# Patient Record
Sex: Female | Born: 1990 | Hispanic: No | Marital: Single | Smoking: Current every day smoker
Health system: Southern US, Community
[De-identification: ages and names within clinical notes are randomized; demographics above are authoritative.]

## PROBLEM LIST (undated history)

## (undated) DIAGNOSIS — J45909 Unspecified asthma, uncomplicated: Secondary | ICD-10-CM

## (undated) DIAGNOSIS — F909 Attention-deficit hyperactivity disorder, unspecified type: Secondary | ICD-10-CM

## (undated) DIAGNOSIS — M722 Plantar fascial fibromatosis: Secondary | ICD-10-CM

## (undated) DIAGNOSIS — F319 Bipolar disorder, unspecified: Secondary | ICD-10-CM

## (undated) DIAGNOSIS — T7840XA Allergy, unspecified, initial encounter: Secondary | ICD-10-CM

## (undated) DIAGNOSIS — N809 Endometriosis, unspecified: Secondary | ICD-10-CM

## (undated) DIAGNOSIS — F329 Major depressive disorder, single episode, unspecified: Secondary | ICD-10-CM

## (undated) DIAGNOSIS — F32A Depression, unspecified: Secondary | ICD-10-CM

## (undated) HISTORY — DX: Depression, unspecified: F32.A

## (undated) HISTORY — DX: Plantar fascial fibromatosis: M72.2

## (undated) HISTORY — DX: Attention-deficit hyperactivity disorder, unspecified type: F90.9

## (undated) HISTORY — DX: Allergy, unspecified, initial encounter: T78.40XA

## (undated) HISTORY — PX: BIOPSY ENDOMETRIAL: PRO11

## (undated) HISTORY — DX: Bipolar disorder, unspecified: F31.9

## (undated) HISTORY — DX: Major depressive disorder, single episode, unspecified: F32.9

---
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
## 2012-12-19 ENCOUNTER — Emergency Department: Payer: Self-pay | Admitting: Emergency Medicine

## 2013-02-15 ENCOUNTER — Emergency Department: Payer: Self-pay | Admitting: Emergency Medicine

## 2013-07-22 ENCOUNTER — Emergency Department: Payer: Self-pay | Admitting: Emergency Medicine

## 2013-07-22 LAB — CBC WITH DIFFERENTIAL/PLATELET
BASOS ABS: 0.1 10*3/uL (ref 0.0–0.1)
BASOS PCT: 0.7 %
EOS PCT: 3.3 %
Eosinophil #: 0.4 10*3/uL (ref 0.0–0.7)
HCT: 39.3 % (ref 35.0–47.0)
HGB: 12.9 g/dL (ref 12.0–16.0)
LYMPHS ABS: 2.8 10*3/uL (ref 1.0–3.6)
Lymphocyte %: 25 %
MCH: 28.7 pg (ref 26.0–34.0)
MCHC: 33 g/dL (ref 32.0–36.0)
MCV: 87 fL (ref 80–100)
MONOS PCT: 5.9 %
Monocyte #: 0.7 x10 3/mm (ref 0.2–0.9)
Neutrophil #: 7.4 10*3/uL — ABNORMAL HIGH (ref 1.4–6.5)
Neutrophil %: 65.1 %
PLATELETS: 206 10*3/uL (ref 150–440)
RBC: 4.51 10*6/uL (ref 3.80–5.20)
RDW: 14.2 % (ref 11.5–14.5)
WBC: 11.3 10*3/uL — ABNORMAL HIGH (ref 3.6–11.0)

## 2013-07-22 LAB — WET PREP, GENITAL

## 2013-07-22 LAB — URINALYSIS, COMPLETE
BACTERIA: NONE SEEN
BLOOD: NEGATIVE
Bilirubin,UR: NEGATIVE
Glucose,UR: NEGATIVE mg/dL (ref 0–75)
Ketone: NEGATIVE
Leukocyte Esterase: NEGATIVE
NITRITE: NEGATIVE
PH: 6 (ref 4.5–8.0)
PROTEIN: NEGATIVE
RBC,UR: 1 /HPF (ref 0–5)
Specific Gravity: 1.029 (ref 1.003–1.030)
Squamous Epithelial: 1

## 2013-07-22 LAB — COMPREHENSIVE METABOLIC PANEL
ALBUMIN: 3.4 g/dL (ref 3.4–5.0)
ALK PHOS: 63 U/L
ANION GAP: 5 — AB (ref 7–16)
AST: 18 U/L (ref 15–37)
BILIRUBIN TOTAL: 0.1 mg/dL — AB (ref 0.2–1.0)
BUN: 10 mg/dL (ref 7–18)
CHLORIDE: 111 mmol/L — AB (ref 98–107)
CREATININE: 0.81 mg/dL (ref 0.60–1.30)
Calcium, Total: 8.8 mg/dL (ref 8.5–10.1)
Co2: 25 mmol/L (ref 21–32)
EGFR (Non-African Amer.): >60
Glucose: 100 mg/dL — ABNORMAL HIGH (ref 65–99)
Osmolality: 280 (ref 275–301)
POTASSIUM: 4 mmol/L (ref 3.5–5.1)
SGPT (ALT): 18 U/L (ref 12–78)
SODIUM: 141 mmol/L (ref 136–145)
Total Protein: 7.3 g/dL (ref 6.4–8.2)

## 2013-07-22 LAB — GC/CHLAMYDIA PROBE AMP

## 2013-07-22 LAB — LIPASE, BLOOD: Lipase: 101 U/L (ref 73–393)

## 2013-11-03 ENCOUNTER — Emergency Department: Payer: Self-pay | Admitting: Emergency Medicine

## 2014-01-17 ENCOUNTER — Emergency Department: Payer: Self-pay | Admitting: Emergency Medicine

## 2014-09-13 ENCOUNTER — Other Ambulatory Visit: Payer: Self-pay

## 2014-09-13 ENCOUNTER — Encounter: Payer: Self-pay | Admitting: Emergency Medicine

## 2014-09-13 ENCOUNTER — Emergency Department: Payer: Self-pay

## 2014-09-13 ENCOUNTER — Emergency Department
Admission: EM | Admit: 2014-09-13 | Discharge: 2014-09-13 | Disposition: A | Discharge status: Home / Self Care | Payer: Self-pay | Attending: Student | Admitting: Student

## 2014-09-13 DIAGNOSIS — Z720 Tobacco use: Secondary | ICD-10-CM | POA: Insufficient documentation

## 2014-09-13 DIAGNOSIS — Z3202 Encounter for pregnancy test, result negative: Secondary | ICD-10-CM | POA: Insufficient documentation

## 2014-09-13 DIAGNOSIS — M545 Low back pain: Secondary | ICD-10-CM | POA: Insufficient documentation

## 2014-09-13 DIAGNOSIS — R55 Syncope and collapse: Secondary | ICD-10-CM | POA: Insufficient documentation

## 2014-09-13 DIAGNOSIS — I951 Orthostatic hypotension: Secondary | ICD-10-CM

## 2014-09-13 HISTORY — DX: Unspecified asthma, uncomplicated: J45.909

## 2014-09-13 HISTORY — DX: Endometriosis, unspecified: N80.9

## 2014-09-13 LAB — URINALYSIS COMPLETE WITH MICROSCOPIC (ARMC ONLY)
Bilirubin Urine: NEGATIVE
Glucose, UA: NEGATIVE mg/dL
Hgb urine dipstick: NEGATIVE
Ketones, ur: NEGATIVE mg/dL
Leukocytes, UA: NEGATIVE
Nitrite: NEGATIVE
Protein, ur: NEGATIVE mg/dL
Specific Gravity, Urine: 1.019 (ref 1.005–1.030)
pH: 5 (ref 5.0–8.0)

## 2014-09-13 LAB — CBC WITH DIFFERENTIAL/PLATELET
Basophils Absolute: 0.1 K/uL (ref 0–0.1)
Basophils Relative: 1 %
Eosinophils Absolute: 0.3 K/uL (ref 0–0.7)
Eosinophils Relative: 2 %
HCT: 40.8 % (ref 35.0–47.0)
Hemoglobin: 13.6 g/dL (ref 12.0–16.0)
Lymphocytes Relative: 25 %
Lymphs Abs: 3.3 K/uL (ref 1.0–3.6)
MCH: 28.1 pg (ref 26.0–34.0)
MCHC: 33.3 g/dL (ref 32.0–36.0)
MCV: 84.5 fL (ref 80.0–100.0)
Monocytes Absolute: 0.8 K/uL (ref 0.2–0.9)
Monocytes Relative: 7 %
Neutro Abs: 8.5 K/uL — ABNORMAL HIGH (ref 1.4–6.5)
Neutrophils Relative %: 65 %
Platelets: 187 K/uL (ref 150–440)
RBC: 4.83 MIL/uL (ref 3.80–5.20)
RDW: 13.4 % (ref 11.5–14.5)
WBC: 12.9 K/uL — ABNORMAL HIGH (ref 3.6–11.0)

## 2014-09-13 LAB — COMPREHENSIVE METABOLIC PANEL WITH GFR
ALT: 17 U/L (ref 14–54)
AST: 18 U/L (ref 15–41)
Albumin: 3.5 g/dL (ref 3.5–5.0)
Alkaline Phosphatase: 45 U/L (ref 38–126)
Anion gap: 7 (ref 5–15)
BUN: 9 mg/dL (ref 6–20)
CO2: 26 mmol/L (ref 22–32)
Calcium: 8.8 mg/dL — ABNORMAL LOW (ref 8.9–10.3)
Chloride: 108 mmol/L (ref 101–111)
Creatinine, Ser: 0.7 mg/dL (ref 0.44–1.00)
GFR calc Af Amer: >60 mL/min (ref >60)
GFR calc non Af Amer: >60 mL/min (ref >60)
Glucose, Bld: 118 mg/dL — ABNORMAL HIGH (ref 65–99)
Potassium: 4 mmol/L (ref 3.5–5.1)
Sodium: 141 mmol/L (ref 135–145)
Total Bilirubin: 0.3 mg/dL (ref 0.3–1.2)
Total Protein: 6.6 g/dL (ref 6.5–8.1)

## 2014-09-13 LAB — GLUCOSE, CAPILLARY: Glucose-Capillary: 110 mg/dL — ABNORMAL HIGH (ref 65–99)

## 2014-09-13 LAB — PREGNANCY, URINE: Preg Test, Ur: NEGATIVE

## 2014-09-13 MED ORDER — OXYCODONE HCL 5 MG PO TABS
ORAL_TABLET | ORAL | Status: AC
  Administered 2014-09-13: 5 mg via ORAL
  Filled 2014-09-13: qty 1

## 2014-09-13 MED ORDER — IBUPROFEN 600 MG PO TABS
600.0000 mg | ORAL_TABLET | Freq: Once | ORAL | Status: AC
  Administered 2014-09-13: 600 mg via ORAL

## 2014-09-13 MED ORDER — OXYCODONE HCL 5 MG PO TABS
5.0000 mg | ORAL_TABLET | Freq: Once | ORAL | Status: AC
  Administered 2014-09-13: 5 mg via ORAL

## 2014-09-13 MED ORDER — IBUPROFEN 600 MG PO TABS
ORAL_TABLET | ORAL | Status: AC
  Administered 2014-09-13: 600 mg via ORAL
  Filled 2014-09-13: qty 1

## 2014-09-13 NOTE — ED Notes (Signed)
Pt presents to ER alert and in NAD. Pt states she donated plasma today and had a syncopal episode at work. Pt reports generalized soreness from hitting the floor. Moving neck normally at this time.

## 2014-09-13 NOTE — ED Provider Notes (Signed)
[facility] Emergency Department Provider Note  ____________________________________________  Time seen: Approximately 2:21 AM  I have reviewed the triage vital signs and the nursing notes.   HISTORY  Chief Complaint Loss of Consciousness    HPI Arvid RightBridget M Pester is a 24 y.o. female with history of asthma and endometriosis presents for evaluation of syncope, sudden onset yesterday, now resolved. Patient reports that yesterday she donated plasma which she usually does to help supplement her income. 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 She is complaining of right back pain and right chest pain, constant, aching with spasms since the fall. She did hit her head but reports that her headache is mild. No neck pain. No vomiting or diarrhea. No chest pain or difficulty breathing. No modifying factors. She has not taken any medication for pain at home.   Past Medical History  Diagnosis Date  . Asthma   . Endometriosis     There are no active problems to display for this patient.   History reviewed. No pertinent past surgical history.  No current outpatient prescriptions on file.  Allergies Review of patient's allergies indicates no known allergies.  History reviewed. No pertinent family history.  Social History History  Substance Use Topics  . Smoking status: Current Every Day Smoker  . Smokeless tobacco: Not on file  . Alcohol Use: No    Review of Systems Constitutional: No fever/chills Eyes: No visual changes. ENT: No sore throat. Cardiovascular: Denies chest pain. Respiratory: Denies shortness of breath. Gastrointestinal: No abdominal pain.  No nausea, no vomiting.  No diarrhea.  No constipation. Genitourinary: Negative for dysuria. Musculoskeletal: + for back pain. Skin: Negative for rash. Neurological: Negative for headaches, focal weakness or  numbness.  10-point ROS otherwise negative.  ____________________________________________   PHYSICAL EXAM:  VITAL SIGNS: ED Triage Vitals  Enc Vitals Group     BP 09/13/14 0141 128/51 mmHg     Pulse Rate 09/13/14 0140 68     Resp 09/13/14 0140 20     Temp 09/13/14 0140 98.7 F (37.1 C)     Temp Source 09/13/14 0140 Oral     SpO2 09/13/14 0207 99 %     Weight 09/13/14 0140 236 lb (107.049 kg)     Height 09/13/14 0140 5\' 6"  (1.676 m)     Head Cir --      Peak Flow --      Pain Score 09/13/14 0140 7     Pain Loc --      Pain Edu? --      Excl. in GC? --     Constitutional: Alert and oriented. Well appearing and in no acute distress. Eyes: Conjunctivae are normal. PERRL. EOMI. Head: Atraumatic. Nose: No congestion/rhinnorhea. Mouth/Throat: Mucous membranes are moist.  Oropharynx non-erythematous. Neck: No stridor. No cervical spine tenderness to palpation. Cardiovascular: Normal rate, regular rhythm. Grossly normal heart sounds.  Good peripheral circulation. Respiratory: Normal respiratory effort.  No retractions. Lungs CTAB. Gastrointestinal: Soft and nontender. No distention. No abdominal bruits. No CVA tenderness. Genitourinary: deferred Musculoskeletal: No lower extremity tenderness nor edema.  No joint effusions. Mild tenderness to palpation throughout the paravertebral muscles of the right lumbar spine, no midline tenderness to palpation throughout the T or L-spine, no bony step-offs or deformity. Neurologic:  Normal speech and language. No gross focal neurologic deficits are appreciated. Speech is normal. No gait instability.  Skin:  Skin is warm, dry and intact. No rash noted. Psychiatric: Mood and affect are normal. Speech and behavior are normal.  ____________________________________________   LABS (all labs ordered are listed, but only abnormal results are displayed)  Labs Reviewed  COMPREHENSIVE METABOLIC PANEL - Abnormal; Notable for the following:     Glucose, Bld 118 (*)    Calcium 8.8 (*)    All other components within normal limits  CBC WITH DIFFERENTIAL/PLATELET - Abnormal; Notable for the following:    WBC 12.9 (*)    Neutro Abs 8.5 (*)    All other components within normal limits  URINALYSIS COMPLETEWITH MICROSCOPIC ([facility] ONLY) - Abnormal; Notable for the following:    Color, Urine YELLOW (*)    APPearance CLEAR (*)    Bacteria, UA RARE (*)    Squamous Epithelial / LPF 0-5 (*)    All other components within normal limits  GLUCOSE, CAPILLARY - Abnormal; Notable for the following:    Glucose-Capillary 110 (*)    All other components within normal limits  PREGNANCY, URINE  POCT CBG (FASTING - GLUCOSE)-MANUAL ENTRY   ____________________________________________  EKG  ED ECG REPORT I, Gayla Doss, the attending physician, personally viewed and interpreted this ECG.   Date: 09/13/2014  EKG Time: 03:28  Rate: 60  Rhythm: normal EKG, normal sinus rhythm  Axis: normal  Intervals:none  ST&T Change: No acute ST segment change.  ____________________________________________  RADIOLOGY  CXR  IMPRESSION: No active cardiopulmonary disease.  ____________________________________________   PROCEDURES  Procedure(s) performed: None  Critical Care performed: No  ____________________________________________   INITIAL IMPRESSION / ASSESSMENT AND PLAN / ED COURSE  Pertinent labs & imaging results that were available during my care of the patient were reviewed by me and considered in my medical decision making (see chart for details).  ACHAIA GARLOCK is a 24 y.o. female with history of asthma and endometriosis presents for evaluation of syncope, sudden onset yesterday, now resolved. On exam, she is generally well-appearing and in no acute distress. Vital signs are stable, she is afebrile. She has mild tenderness to palpation throughout the paravertebral muscles of the right lumbar spine spine and I suspect her  injuries are musculoskeletal in nature. Suspect vasovagal syncope, likely orthostatic syncope in the setting of intravascular depletion given plasma donation today. EKG is reassuring, no chest pain no difficulty breathing, intact neuro exam, doubt purely neurogenic or cardiac cause of syncope in this young, healthy patient. Pain significantly improved at this point. I have cautioned her about risks of dehydration with plasma donation. She has declined IV fluids here. Return precautions discussed. DC home. No indication for head or C-spine imaging according to [place] CT head and C-spine rules. Labs with mild leukocytosis which is nonspecific and likely stressed/catecholamine induced. ____________________________________________   FINAL CLINICAL IMPRESSION(S) / ED DIAGNOSES  Final diagnoses:  Orthostatic syncope      Gayla Doss, MD 09/13/14 [phone]

## 2014-09-13 NOTE — ED Notes (Signed)
Pt presents to ED d/t pain caused by a fall at work around 12hrs ago. Pt reports giving plasma 2hrs prior to the syncopal episode. Pt states she "got really, really hot and the next thing I knew I was on the ground". Pt reports the fall was witnessed and reports she "just hit the floor". Pt c/o back pain, right-sided shoulder pain, and right-sided chest pain. Pt is very anxious and tearful, but otherwise in NAD, A&O, with s/o at bedside.

## 2015-03-19 ENCOUNTER — Ambulatory Visit (INDEPENDENT_AMBULATORY_CARE_PROVIDER_SITE_OTHER): Payer: Medicaid Other

## 2015-03-19 VITALS — BP 115/68 | HR 64 | Wt 235.5 lb

## 2015-03-19 DIAGNOSIS — Z3491 Encounter for supervision of normal pregnancy, unspecified, first trimester: Secondary | ICD-10-CM

## 2015-03-19 DIAGNOSIS — Z8742 Personal history of other diseases of the female genital tract: Secondary | ICD-10-CM

## 2015-03-19 DIAGNOSIS — Z36 Encounter for antenatal screening of mother: Secondary | ICD-10-CM

## 2015-03-19 DIAGNOSIS — Z3687 Encounter for antenatal screening for uncertain dates: Secondary | ICD-10-CM

## 2015-03-19 DIAGNOSIS — R638 Other symptoms and signs concerning food and fluid intake: Secondary | ICD-10-CM

## 2015-03-19 DIAGNOSIS — Z113 Encounter for screening for infections with a predominantly sexual mode of transmission: Secondary | ICD-10-CM

## 2015-03-19 NOTE — Progress Notes (Signed)
Arvid RightBridget M Heatley presents for NOB nurse interview visit. G-1.  P-0.  Confirmation at [facility]. LMP - 01/12/2015- EDD- 10/19/2015.  9 3/7   H/o of endometriosis. Pregnancy education material explained and given. _NO__ cats in the home. NOB labs ordered. TSH and HbgA1 ordered due to Increased BMI. HIV lab and Drug screen were explained and ordered. PNV encouraged. NT to discuss with provider. Mild nausea and vomiting NO meds needed at this time. Needs tdap at 28 weeks. Last pap -2013 -neg. Viability scan in 1-2 weeks.  Pt. To follow up with provider in _+/- 3_ weeks for NOB physical with [facility].  All questions answered.

## 2015-03-19 NOTE — Patient Instructions (Signed)
Commonly Asked Questions During Pregnancy  Cats: A parasite can be excreted in cat feces.  To avoid exposure you need to have another person empty the little box.  If you must empty the litter box you will need to wear gloves.  Wash your hands after handling your cat.  This parasite can also be found in raw or undercooked meat so this should also be avoided.  Colds, Sore Throats, Flu: Please check your medication sheet to see what you can take for symptoms.  If your symptoms are unrelieved by these medications please call the office.  Dental Work: Most any dental work your dentist recommends is permitted.  X-rays should only be taken during the first trimester if absolutely necessary.  Your abdomen should be shielded with a lead apron during all x-rays.  Please notify your provider prior to receiving any x-rays.  Novocaine is fine; gas is not recommended.  If your dentist requires a note from us prior to dental work please call the office and we will provide one for you.  Exercise: Exercise is an important part of staying healthy during your pregnancy.  You may continue most exercises you were accustomed to prior to pregnancy.  Later in your pregnancy you will most likely notice you have difficulty with activities requiring balance like riding a bicycle.  It is important that you listen to your body and avoid activities that put you at a higher risk of falling.  Adequate rest and staying well hydrated are a must!  If you have questions about the safety of specific activities ask your provider.    Exposure to Children with illness: Try to avoid obvious exposure; report any symptoms to us when noted,  If you have chicken pos, red measles or mumps, you should be immune to these diseases.   Please do not take any vaccines while pregnant unless you have checked with your OB provider.  Fetal Movement: After 28 weeks we recommend you do "kick counts" twice daily.  Lie or sit down in a calm quiet environment and  count your baby movements "kicks".  You should feel your baby at least 10 times per hour.  If you have not felt 10 kicks within the first hour get up, walk around and have something sweet to eat or drink then repeat for an additional hour.  If count remains less than 10 per hour notify your provider.  Fumigating: Follow your pest control agent's advice as to how long to stay out of your home.  Ventilate the area well before re-entering.  Hemorrhoids:   Most over-the-counter preparations can be used during pregnancy.  Check your medication to see what is safe to use.  It is important to use a stool softener or fiber in your diet and to drink lots of liquids.  If hemorrhoids seem to be getting worse please call the office.   Hot Tubs:  Hot tubs Jacuzzis and saunas are not recommended while pregnant.  These increase your internal body temperature and should be avoided.  Intercourse:  Sexual intercourse is safe during pregnancy as long as you are comfortable, unless otherwise advised by your provider.  Spotting may occur after intercourse; report any bright red bleeding that is heavier than spotting.  Labor:  If you know that you are in labor, please go to the hospital.  If you are unsure, please call the office and let us help you decide what to do.  Lifting, straining, etc:  If your job requires heavy   lifting or straining please check with your provider for any limitations.  Generally, you should not lift items heavier than that you can lift simply with your hands and arms (no back muscles)  Painting:  Paint fumes do not harm your pregnancy, but may make you ill and should be avoided if possible.  Latex or water based paints have less odor than oils.  Use adequate ventilation while painting.  Permanents & Hair Color:  Chemicals in hair dyes are not recommended as they cause increase hair dryness which can increase hair loss during pregnancy.  " Highlighting" and permanents are allowed.  Dye may be  absorbed differently and permanents may not hold as well during pregnancy.  Sunbathing:  Use a sunscreen, as skin burns easily during pregnancy.  Drink plenty of fluids; avoid over heating.  Tanning Beds:  Because their possible side effects are still unknown, tanning beds are not recommended.  Ultrasound Scans:  Routine ultrasounds are performed at approximately 20 weeks.  You will be able to see your baby's general anatomy an if you would like to know the gender this can usually be determined as well.  If it is questionable when you conceived you may also receive an ultrasound early in your pregnancy for dating purposes.  Otherwise ultrasound exams are not routinely performed unless there is a medical necessity.  Although you can request a scan we ask that you pay for it when conducted because insurance does not cover " patient request" scans.  Work: If your pregnancy proceeds without complications you may work until your due date, unless your physician or employer advises otherwise.  Round Ligament Pain/Pelvic Discomfort:  Sharp, shooting pains not associated with bleeding are fairly common, usually occurring in the second trimester of pregnancy.  They tend to be worse when standing up or when you remain standing for long periods of time.  These are the result of pressure of certain pelvic ligaments called "round ligaments".  Rest, Tylenol and heat seem to be the most effective relief.  As the womb and fetus grow, they rise out of the pelvis and the discomfort improves.  Please notify the office if your pain seems different than that described.  It may represent a more serious condition. First Trimester of Pregnancy The first trimester of pregnancy is from week 1 until the end of week 12 (months 1 through 3). A week after a sperm fertilizes an egg, the egg will implant on the wall of the uterus. This embryo will begin to develop into a baby. Genes from you and your partner are forming the baby. The  female genes determine whether the baby is a boy or a girl. At 6-8 weeks, the eyes and face are formed, and the heartbeat can be seen on ultrasound. At the end of 12 weeks, all the baby's organs are formed.  Now that you are pregnant, you will want to do everything you can to have a healthy baby. Two of the most important things are to get good prenatal care and to follow your health care provider's instructions. Prenatal care is all the medical care you receive before the baby's birth. This care will help prevent, find, and treat any problems during the pregnancy and childbirth. BODY CHANGES Your body goes through many changes during pregnancy. The changes vary from woman to woman.   You may gain or lose a couple of pounds at first.  You may feel sick to your stomach (nauseous) and throw up (vomit). If the   vomiting is uncontrollable, call your health care provider.  You may tire easily.  You may develop headaches that can be relieved by medicines approved by your health care provider.  You may urinate more often. Painful urination may mean you have a bladder infection.  You may develop heartburn as a result of your pregnancy.  You may develop constipation because certain hormones are causing the muscles that push waste through your intestines to slow down.  You may develop hemorrhoids or swollen, bulging veins (varicose veins).  Your breasts may begin to grow larger and become tender. Your nipples may stick out more, and the tissue that surrounds them (areola) may become darker.  Your gums may bleed and may be sensitive to brushing and flossing.  Dark spots or blotches (chloasma, mask of pregnancy) may develop on your face. This will likely fade after the baby is born.  Your menstrual periods will stop.  You may have a loss of appetite.  You may develop cravings for certain kinds of food.  You may have changes in your emotions from day to day, such as being excited to be pregnant or  being concerned that something may go wrong with the pregnancy and baby.  You may have more vivid and strange dreams.  You may have changes in your hair. These can include thickening of your hair, rapid growth, and changes in texture. Some women also have hair loss during or after pregnancy, or hair that feels dry or thin. Your hair will most likely return to normal after your baby is born. WHAT TO EXPECT AT YOUR PRENATAL VISITS During a routine prenatal visit:  You will be weighed to make sure you and the baby are growing normally.  Your blood pressure will be taken.  Your abdomen will be measured to track your baby's growth.  The fetal heartbeat will be listened to starting around week 10 or 12 of your pregnancy.  Test results from any previous visits will be discussed. Your health care provider may ask you:  How you are feeling.  If you are feeling the baby move.  If you have had any abnormal symptoms, such as leaking fluid, bleeding, severe headaches, or abdominal cramping.  If you are using any tobacco products, including cigarettes, chewing tobacco, and electronic cigarettes.  If you have any questions. Other tests that may be performed during your first trimester include:  Blood tests to find your blood type and to check for the presence of any previous infections. They will also be used to check for low iron levels (anemia) and Rh antibodies. Later in the pregnancy, blood tests for diabetes will be done along with other tests if problems develop.  Urine tests to check for infections, diabetes, or protein in the urine.  An ultrasound to confirm the proper growth and development of the baby.  An amniocentesis to check for possible genetic problems.  Fetal screens for spina bifida and Down syndrome.  You may need other tests to make sure you and the baby are doing well.  HIV (human immunodeficiency virus) testing. Routine prenatal testing includes screening for HIV,  unless you choose not to have this test. HOME CARE INSTRUCTIONS  Medicines  Follow your health care provider's instructions regarding medicine use. Specific medicines may be either safe or unsafe to take during pregnancy.  Take your prenatal vitamins as directed.  If you develop constipation, try taking a stool softener if your health care provider approves. Diet  Eat regular, well-balanced meals. Choose   a variety of foods, such as meat or vegetable-based protein, fish, milk and low-fat dairy products, vegetables, fruits, and whole grain breads and cereals. Your health care provider will help you determine the amount of weight gain that is right for you.  Avoid raw meat and uncooked cheese. These carry germs that can cause birth defects in the baby.  Eating four or five small meals rather than three large meals a day may help relieve nausea and vomiting. If you start to feel nauseous, eating a few soda crackers can be helpful. Drinking liquids between meals instead of during meals also seems to help nausea and vomiting.  If you develop constipation, eat more high-fiber foods, such as fresh vegetables or fruit and whole grains. Drink enough fluids to keep your urine clear or pale yellow. Activity and Exercise  Exercise only as directed by your health care provider. Exercising will help you:  Control your weight.  Stay in shape.  Be prepared for labor and delivery.  Experiencing pain or cramping in the lower abdomen or low back is a good sign that you should stop exercising. Check with your health care provider before continuing normal exercises.  Try to avoid standing for long periods of time. Move your legs often if you must stand in one place for a long time.  Avoid heavy lifting.  Wear low-heeled shoes, and practice good posture.  You may continue to have sex unless your health care provider directs you otherwise. Relief of Pain or Discomfort  Wear a good support bra for  breast tenderness.   Take warm sitz baths to soothe any pain or discomfort caused by hemorrhoids. Use hemorrhoid cream if your health care provider approves.   Rest with your legs elevated if you have leg cramps or low back pain.  If you develop varicose veins in your legs, wear support hose. Elevate your feet for 15 minutes, 3-4 times a day. Limit salt in your diet. Prenatal Care  Schedule your prenatal visits by the twelfth week of pregnancy. They are usually scheduled monthly at first, then more often in the last 2 months before delivery.  Write down your questions. Take them to your prenatal visits.  Keep all your prenatal visits as directed by your health care provider. Safety  Wear your seat belt at all times when driving.  Make a list of emergency phone numbers, including numbers for family, friends, the hospital, and police and fire departments. General Tips  Ask your health care provider for a referral to a local prenatal education class. Begin classes no later than at the beginning of month 6 of your pregnancy.  Ask for help if you have counseling or nutritional needs during pregnancy. Your health care provider can offer advice or refer you to specialists for help with various needs.  Do not use hot tubs, steam rooms, or saunas.  Do not douche or use tampons or scented sanitary pads.  Do not cross your legs for long periods of time.  Avoid cat litter boxes and soil used by cats. These carry germs that can cause birth defects in the baby and possibly loss of the fetus by miscarriage or stillbirth.  Avoid all smoking, herbs, alcohol, and medicines not prescribed by your health care provider. Chemicals in these affect the formation and growth of the baby.  Do not use any tobacco products, including cigarettes, chewing tobacco, and electronic cigarettes. If you need help quitting, ask your health care provider. You may receive counseling support and   other resources to help  you quit.  Schedule a dentist appointment. At home, brush your teeth with a soft toothbrush and be gentle when you floss. SEEK MEDICAL CARE IF:   You have dizziness.  You have mild pelvic cramps, pelvic pressure, or nagging pain in the abdominal area.  You have persistent nausea, vomiting, or diarrhea.  You have a bad smelling vaginal discharge.  You have pain with urination.  You notice increased swelling in your face, hands, legs, or ankles. SEEK IMMEDIATE MEDICAL CARE IF:   You have a fever.  You are leaking fluid from your vagina.  You have spotting or bleeding from your vagina.  You have severe abdominal cramping or pain.  You have rapid weight gain or loss.  You vomit blood or material that looks like coffee grounds.  You are exposed to German measles and have never had them.  You are exposed to fifth disease or chickenpox.  You develop a severe headache.  You have shortness of breath.  You have any kind of trauma, such as from a fall or a car accident.   This information is not intended to replace advice given to you by your health care provider. Make sure you discuss any questions you have with your health care provider.   Document Released: 02/21/2001 Document Revised: 03/20/2014 Document Reviewed: 01/07/2013 Elsevier Interactive Patient Education 2016 [employer] How a Baby Grows During Pregnancy Pregnancy begins when a female's sperm enters a female's egg (fertilization). This happens in one of the tubes (fallopian tubes) that connect the ovaries to the womb (uterus). The fertilized egg is called an embryo until it reaches 10 weeks. From 10 weeks until birth, it is called a fetus. The fertilized egg moves down the fallopian tube to the uterus. Then it implants into the lining of the uterus and begins to grow. The developing fetus receives oxygen and nutrients through the pregnant woman's bloodstream and the tissues that grow (placenta) to support the fetus.  The placenta is the life support system for the fetus. It provides nutrition and removes waste. Learning as much as you can about your pregnancy and how your baby is developing can help you enjoy the experience. It can also make you aware of when there might be a problem and when to ask questions. HOW LONG DOES A TYPICAL PREGNANCY LAST? A pregnancy usually lasts 280 days, or about 40 weeks. Pregnancy is divided into three trimesters:  First trimester: 0-13 weeks.  Second trimester: 14-27 weeks.  Third trimester: 28-40 weeks. The day when your baby is considered ready to be born (full term) is your estimated date of delivery. HOW DOES MY BABY DEVELOP MONTH BY MONTH? First month  The fertilized egg attaches to the inside of the uterus.  Some cells will form the placenta. Others will form the fetus.  The arms, legs, brain, spinal cord, lungs, and heart begin to develop.  At the end of the first month, the heart begins to beat. Second month  The bones, inner ear, eyelids, hands, and feet form.  The genitals develop.  By the end of 8 weeks, all major organs are developing. Third month  All of the internal organs are forming.  Teeth develop below the gums.  Bones and muscles begin to grow. The spine can flex.  The skin is transparent.  Fingernails and toenails begin to form.  Arms and legs continue to grow longer, and hands and feet develop.  The fetus is about 3 in (7.6   cm) long. Fourth month  The placenta is completely formed.  The external sex organs, neck, outer ear, eyebrows, eyelids, and fingernails are formed.  The fetus can hear, swallow, and move its arms and legs.  The kidneys begin to produce urine.  The skin is covered with a white waxy coating (vernix) and very fine hair (lanugo). Fifth month  The fetus moves around more and can be felt for the first time (quickening).  The fetus starts to sleep and wake up and may begin to suck its finger.  The  nails grow to the end of the fingers.  The organ in the digestive system that makes bile (gallbladder) functions and helps to digest the nutrients.  If your baby is a girl, eggs are present in her ovaries. If your baby is a boy, testicles start to move down into his scrotum. Sixth month  The lungs are formed, but the fetus is not yet able to breathe.  The eyes open. The brain continues to develop.  Your baby has fingerprints and toe prints. Your baby's hair grows thicker.  At the end of the second trimester, the fetus is about 9 in (22.9 cm) long. Seventh month  The fetus kicks and stretches.  The eyes are developed enough to sense changes in light.  The hands can make a grasping motion.  The fetus responds to sound. Eighth month  All organs and body systems are fully developed and functioning.  Bones harden and taste buds develop. The fetus may hiccup.  Certain areas of the brain are still developing. The skull remains soft. Ninth month  The fetus gains about  lb (0.23 kg) each week.  The lungs are fully developed.  Patterns of sleep develop.  The fetus's head typically moves into a head-down position (vertex) in the uterus to prepare for birth. If the buttocks move into a vertex position instead, the baby is breech.  The fetus weighs 6-9 lbs (2.72-4.08 kg) and is 19-20 in (48.26-50.8 cm) long. WHAT CAN I DO TO HAVE A HEALTHY PREGNANCY AND HELP MY BABY DEVELOP? Eating and Drinking  Eat a healthy diet.  Talk with your health care provider to make sure that you are getting the nutrients that you and your baby need.  Visit www.choosemyplate.gov to learn about creating a healthy diet.  Gain a healthy amount of weight during pregnancy as advised by your health care provider. This is usually 25-35 pounds. You may need to:  Gain more if you were underweight before getting pregnant or if you are pregnant with more than one baby.  Gain less if you were overweight or  obese when you got pregnant. Medicines and Vitamins  Take prenatal vitamins as directed by your health care provider. These include vitamins such as folic acid, iron, calcium, and vitamin D. They are important for healthy development.  Take medicines only as directed by your health care provider. Read labels and ask a pharmacist or your health care provider whether over-the-counter medicines, supplements, and prescription drugs are safe to take during pregnancy. Activities  Be physically active as advised by your health care provider. Ask your health care provider to recommend activities that are safe for you to do, such as walking or swimming.  Do not participate in strenuous or extreme sports. Lifestyle  Do not drink alcohol.  Do not use any tobacco products, including cigarettes, chewing tobacco, or electronic cigarettes. If you need help quitting, ask your health care provider.  Do not use illegal drugs.   Safety  Avoid exposure to mercury, lead, or other heavy metals. Ask your health care provider about common sources of these heavy metals.  Avoid listeria infection during pregnancy. Follow these precautions:  Do not eat soft cheeses or deli meats.  Do not eat hot dogs unless they have been warmed up to the point of steaming, such as in the microwave oven.  Do not drink unpasteurized milk.  Avoid toxoplasmosis infection during pregnancy. Follow these precautions:  Do not change your cat's litter box, if you have a cat. Ask someone else to do this for you.  Wear gardening gloves while working in the yard. General Instructions  Keep all follow-up visits as directed by your health care provider. This is important. This includes prenatal care and screening tests.  Manage any chronic health conditions. Work closely with your health care provider to keep conditions, such as diabetes, under control. HOW DO I KNOW IF MY BABY IS DEVELOPING WELL? At each prenatal visit, your health  care provider will do several different tests to check on your health and keep track of your baby's development. These include:  Fundal height.  Your health care provider will measure your growing belly from top to bottom using a tape measure.  Your health care provider will also feel your belly to determine your baby's position.  Heartbeat.  An ultrasound in the first trimester can confirm pregnancy and show a heartbeat, depending on how far along you are.  Your health care provider will check your baby's heart rate at every prenatal visit.  As you get closer to your delivery date, you may have regular fetal heart rate monitoring to make sure that your baby is not in distress.  Second trimester ultrasound.  This ultrasound checks your baby's development. It also indicates your baby's gender. WHAT SHOULD I DO IF I HAVE CONCERNS ABOUT MY BABY'S DEVELOPMENT? Always talk with your health care provider about any concerns that you may have.   This information is not intended to replace advice given to you by your health care provider. Make sure you discuss any questions you have with your health care provider.   Document Released: 08/16/2007 Document Revised: 11/18/2014 Document Reviewed: 08/06/2013 Elsevier Interactive Patient Education 2016 [employer]     

## 2015-03-20 LAB — CBC WITH DIFFERENTIAL/PLATELET
Basophils Absolute: 0 x10E3/uL (ref 0.0–0.2)
Basos: 1 %
EOS (ABSOLUTE): 0.2 x10E3/uL (ref 0.0–0.4)
Eos: 3 %
Hematocrit: 38.5 % (ref 34.0–46.6)
Hemoglobin: 12.9 g/dL (ref 11.1–15.9)
Immature Grans (Abs): 0 x10E3/uL (ref 0.0–0.1)
Immature Granulocytes: 0 %
Lymphocytes Absolute: 1.8 x10E3/uL (ref 0.7–3.1)
Lymphs: 25 %
MCH: 26.2 pg — ABNORMAL LOW (ref 26.6–33.0)
MCHC: 33.5 g/dL (ref 31.5–35.7)
MCV: 78 fL — ABNORMAL LOW (ref 79–97)
Monocytes Absolute: 0.5 x10E3/uL (ref 0.1–0.9)
Monocytes: 7 %
Neutrophils Absolute: 4.7 x10E3/uL (ref 1.4–7.0)
Neutrophils: 64 %
Platelets: 250 x10E3/uL (ref 150–379)
RBC: 4.92 x10E6/uL (ref 3.77–5.28)
RDW: 14.2 % (ref 12.3–15.4)
WBC: 7.2 x10E3/uL (ref 3.4–10.8)

## 2015-03-20 LAB — URINALYSIS, ROUTINE W REFLEX MICROSCOPIC
Bilirubin, UA: NEGATIVE
GLUCOSE, UA: NEGATIVE
KETONES UA: NEGATIVE
Leukocytes, UA: NEGATIVE
NITRITE UA: NEGATIVE
PROTEIN UA: NEGATIVE
RBC, UA: NEGATIVE
SPEC GRAV UA: 1.026 (ref 1.005–1.030)
UUROB: 0.2 mg/dL (ref 0.2–1.0)
pH, UA: 6 (ref 5.0–7.5)

## 2015-03-20 LAB — RPR: RPR: NONREACTIVE

## 2015-03-20 LAB — "ABO AND RH ": Rh Factor: POSITIVE

## 2015-03-20 LAB — TSH: TSH: 0.781 u[IU]/mL (ref 0.450–4.500)

## 2015-03-20 LAB — HEPATITIS B SURFACE ANTIGEN: HEP B S AG: NEGATIVE

## 2015-03-20 LAB — HEMOGLOBIN A1C
ESTIMATED AVERAGE GLUCOSE: 108 mg/dL
HEMOGLOBIN A1C: 5.4 % (ref 4.8–5.6)

## 2015-03-20 LAB — ANTIBODY SCREEN: ANTIBODY SCREEN: NEGATIVE

## 2015-03-20 LAB — HIV ANTIBODY (ROUTINE TESTING W REFLEX): HIV SCREEN 4TH GENERATION: NONREACTIVE

## 2015-03-21 LAB — CULTURE, OB URINE

## 2015-03-21 LAB — VARICELLA ZOSTER ANTIBODY, IGG: VARICELLA: 2401 {index} (ref >165)

## 2015-03-21 LAB — RUBELLA SCREEN: Rubella Antibodies, IGG: 2.9 index (ref >0.99)

## 2015-03-21 LAB — URINE CULTURE, OB REFLEX: Organism ID, Bacteria: NO GROWTH

## 2015-03-23 LAB — GC/CHLAMYDIA PROBE AMP
Chlamydia trachomatis, NAA: NEGATIVE
Neisseria gonorrhoeae by PCR: NEGATIVE

## 2015-03-25 LAB — PAIN MGT SCRN (14 DRUGS), UR
Amphetamine Screen, Ur: NEGATIVE ng/mL
BUPRENORPHINE, URINE: NEGATIVE ng/mL
Barbiturate Screen, Ur: NEGATIVE ng/mL
Benzodiazepine Screen, Urine: NEGATIVE ng/mL
CREATININE(CRT), U: 227.5 mg/dL (ref 20.0–300.0)
Cannabinoids Ur Ql Scn: POSITIVE ng/mL
Cocaine(Metab.)Screen, Urine: NEGATIVE ng/mL
FENTANYL, URINE: NEGATIVE pg/mL
MEPERIDINE SCREEN, URINE: NEGATIVE ng/mL
Methadone Scn, Ur: NEGATIVE ng/mL
OPIATE SCRN UR: NEGATIVE ng/mL
OXYCODONE+OXYMORPHONE UR QL SCN: NEGATIVE ng/mL
PCP SCRN UR: NEGATIVE ng/mL
PROPOXYPHENE SCREEN: NEGATIVE ng/mL
Ph of Urine: 5.6 (ref 4.5–8.9)
TRAMADOL UR QL SCN: NEGATIVE ng/mL

## 2015-03-25 LAB — NICOTINE SCREEN, URINE: Cotinine Ql Scrn, Ur: POSITIVE ng/mL

## 2015-03-26 ENCOUNTER — Ambulatory Visit (INDEPENDENT_AMBULATORY_CARE_PROVIDER_SITE_OTHER): Payer: Medicaid Other

## 2015-03-26 DIAGNOSIS — Z3687 Encounter for antenatal screening for uncertain dates: Secondary | ICD-10-CM

## 2015-03-26 DIAGNOSIS — Z36 Encounter for antenatal screening of mother: Secondary | ICD-10-CM | POA: Diagnosis not present

## 2015-04-08 ENCOUNTER — Encounter: Payer: Medicaid Other | Admitting: Obstetrics and Gynecology

## 2015-04-09 ENCOUNTER — Ambulatory Visit (INDEPENDENT_AMBULATORY_CARE_PROVIDER_SITE_OTHER): Payer: Medicaid Other

## 2015-04-09 DIAGNOSIS — Z36 Encounter for antenatal screening of mother: Secondary | ICD-10-CM | POA: Diagnosis not present

## 2015-04-09 DIAGNOSIS — Z3687 Encounter for antenatal screening for uncertain dates: Secondary | ICD-10-CM

## 2015-04-14 HISTORY — PX: DILATION AND CURETTAGE OF UTERUS: SHX78

## 2015-04-16 ENCOUNTER — Telehealth: Payer: Self-pay

## 2015-04-16 DIAGNOSIS — O021 Missed abortion: Secondary | ICD-10-CM

## 2015-04-16 NOTE — Telephone Encounter (Signed)
Called pt to access her current condition and inform her of the need to come back in for follow up. Pt states that she is very upset with our office and does not wish to come here any longer. After asking pt about this she states that at her first ultrasound she informed ultrasound tech that she was bleeding and had passed a clot, pt states she was told by ultrasound tech that bleeding was normal and was likely caused by implantation. Pt then came in for repeat u/s (initial scan only showed empty sac) at that time pt was told by ultrasound tech that she was having a miscarriage. Pt states that she should have been seen and taken more seriously when she complained of bleeding initially. (Pt is very angry and yelling) Advised of the risks of infection and the need to be seen and treated. PT DENIES APPT AT THIS TIME. Will forward message to provider for notification.

## 2015-04-21 ENCOUNTER — Other Ambulatory Visit: Payer: Self-pay | Admitting: Obstetrics and Gynecology

## 2015-04-21 DIAGNOSIS — O021 Missed abortion: Secondary | ICD-10-CM

## 2015-04-21 NOTE — Addendum Note (Signed)
Addended by: Fabian November on: 04/21/2015 01:49 PM   Modules accepted: Orders

## 2015-04-21 NOTE — Telephone Encounter (Signed)
Contacted patient regarding her missed abortion (empty gestational sac).  Apologized to patient for information given by ultrasound technologist as it was not official information and patient was not seen by MD (left on day of second ultrasound prior to being seen by MD due to being angry).  Inquired to patient's current status, if she had follow up with another office.  Patient notes that she has not established follow up anywhere else, however has started bleeding (currently on 3rd day).  Has not yet passed any tissue products.  Recommended that patient have an ultrasound and hormone levels drawn on Friday to assess if miscarriage is near completion.  Patient agrees to have these performed at [facility].  Will have receptionist schedule and follow up accordingly.    Hildred Laser, MD [facility]

## 2015-04-23 ENCOUNTER — Other Ambulatory Visit
Admission: RE | Admit: 2015-04-23 | Discharge: 2015-04-23 | Disposition: A | Discharge status: Home / Self Care | Payer: Medicaid Other | Source: Ambulatory Visit | Attending: Obstetrics and Gynecology | Admitting: Obstetrics and Gynecology

## 2015-04-23 ENCOUNTER — Ambulatory Visit
Admission: RE | Admit: 2015-04-23 | Discharge: 2015-04-23 | Disposition: A | Discharge status: Home / Self Care | Payer: Medicaid Other | Source: Ambulatory Visit | Attending: Obstetrics and Gynecology | Admitting: Obstetrics and Gynecology

## 2015-04-23 DIAGNOSIS — O021 Missed abortion: Secondary | ICD-10-CM | POA: Insufficient documentation

## 2015-04-23 LAB — HCG, QUANTITATIVE, PREGNANCY: hCG, Beta Chain, Quant, S: 1 m[IU]/mL (ref <5)

## 2015-04-24 ENCOUNTER — Telehealth: Payer: Self-pay | Admitting: Obstetrics and Gynecology

## 2015-04-24 NOTE — Telephone Encounter (Signed)
Contacted patient regarding ultrasound and BHCG levels to f/u recent miscarriage.  Ultrasound notes empty uterine cavity and normal endometrial stripe.  BHCG levels have trended to <1. Patient has had complete AB.  No further intervention required.  Advised on waiting at least 1- 2 months until cycles become regular again prior to attempting conception again if desired.  Otherwise, can schedule an appointment to discuss contraception options.    Hildred Laser, MD [facility]

## 2015-04-28 ENCOUNTER — Encounter: Payer: Self-pay | Admitting: Obstetrics and Gynecology

## 2015-05-11 ENCOUNTER — Encounter: Payer: Medicaid Other | Admitting: Obstetrics and Gynecology

## 2016-01-22 ENCOUNTER — Encounter (HOSPITAL_COMMUNITY): Payer: Self-pay

## 2016-05-24 ENCOUNTER — Encounter (INDEPENDENT_AMBULATORY_CARE_PROVIDER_SITE_OTHER): Payer: Self-pay

## 2016-05-24 ENCOUNTER — Encounter: Payer: Self-pay | Admitting: Physician Assistant

## 2016-05-24 ENCOUNTER — Ambulatory Visit (INDEPENDENT_AMBULATORY_CARE_PROVIDER_SITE_OTHER): Payer: Medicaid Other | Admitting: Physician Assistant

## 2016-05-24 VITALS — BP 111/62 | HR 86 | Temp 98.7°F | Ht 66.0 in | Wt 239.0 lb

## 2016-05-24 DIAGNOSIS — Z0000 Encounter for general adult medical examination without abnormal findings: Secondary | ICD-10-CM | POA: Diagnosis not present

## 2016-05-24 DIAGNOSIS — N809 Endometriosis, unspecified: Secondary | ICD-10-CM

## 2016-05-24 DIAGNOSIS — R110 Nausea: Secondary | ICD-10-CM

## 2016-05-24 DIAGNOSIS — N912 Amenorrhea, unspecified: Secondary | ICD-10-CM

## 2016-05-24 DIAGNOSIS — O09299 Supervision of pregnancy with other poor reproductive or obstetric history, unspecified trimester: Secondary | ICD-10-CM

## 2016-05-24 DIAGNOSIS — Z3A01 Less than 8 weeks gestation of pregnancy: Secondary | ICD-10-CM

## 2016-05-24 LAB — PREGNANCY, URINE: Preg Test, Ur: POSITIVE — AB

## 2016-05-24 MED ORDER — ONDANSETRON HCL 4 MG PO TABS: 4.0000 mg | ORAL_TABLET | Freq: Three times a day (TID) | ORAL | 1 refills | Status: DC | PRN

## 2016-05-24 NOTE — Patient Instructions (Signed)
Abdominal Pain During Pregnancy °Belly (abdominal) pain is common during pregnancy. Most of the time, it is not a serious problem. Other times, it can be a sign that something is wrong with the pregnancy. Always tell your doctor if you have belly pain. °Follow these instructions at home: °Monitor your belly pain for any changes. The following actions may help you feel better: °· Do not have sex (intercourse) or put anything in your vagina until you feel better. °· Rest until your pain stops. °· Drink clear fluids if you feel sick to your stomach (nauseous). Do not eat solid food until you feel better. °· Only take medicine as told by your doctor. °· Keep all doctor visits as told. °Get help right away if: °· You are bleeding, leaking fluid, or pieces of tissue come out of your vagina. °· You have more pain or cramping. °· You keep throwing up (vomiting). °· You have pain when you pee (urinate) or have blood in your pee. °· You have a fever. °· You do not feel your baby moving as much. °· You feel very weak or feel like passing out. °· You have trouble breathing, with or without belly pain. °· You have a very bad headache and belly pain. °· You have fluid leaking from your vagina and belly pain. °· You keep having watery poop (diarrhea). °· Your belly pain does not go away after resting, or the pain gets worse. °This information is not intended to replace advice given to you by your health care provider. Make sure you discuss any questions you have with your health care provider. °Document Released: 02/15/2009 Document Revised: 10/06/2015 Document Reviewed: 09/26/2012 °Elsevier Interactive Patient Education © 2017 Elsevier Inc. ° °

## 2016-05-24 NOTE — Progress Notes (Signed)
BP 111/62   Pulse 86   Temp 98.7 F (37.1 C) (Oral)   Ht 5\' 6"  (1.676 m)   Wt 239 lb (108.4 kg)   LMP 04/01/2016   BMI 38.58 kg/m    Subjective:    Patient ID: Rachel Gregory, female    DOB: 24-Jul-1990, 24 y.o.   MRN: [id number]  HPI: Rachel Gregory is a 26 y.o. female presenting on 05/24/2016 for New Patient (Initial Visit) and Establish Care  This patient comes in for a new visit today. She does have family planning Medicaid. She has taken some pregnancy tests at home one was positive the other she does not think she did correctly. We will do one today in the office, and the result was positive. Approximately one year ago she had a spontaneous abortion of twins. She was [facility] through medical care and [facility]. She had been told from a young age due to her endometriosis that she would have a very hard time getting pregnant and maintaining her pregnancy. She somewhat concerned today. We will plan for gynecology referral as soon as possible. She states she does have prenatal vitamins at home.  Relevant past medical, surgical, family and social history reviewed and updated as indicated. Allergies and medications reviewed and updated.  Past Medical History:  Diagnosis Date  . ADHD (attention deficit hyperactivity disorder)   . Allergy   . Asthma   . Bipolar 1 disorder (HCC)   . Depression   . Endometriosis   . Miscarriage within last 12 months 04/2015    Past Surgical History:  Procedure Laterality Date  . DILATION AND CURETTAGE OF UTERUS  04/2015   spontaneous abortion    Review of Systems  Constitutional: Positive for fatigue. Negative for activity change and fever.  HENT: Negative.   Eyes: Negative.   Respiratory: Negative.  Negative for cough.   Cardiovascular: Negative.  Negative for chest pain.  Gastrointestinal: Positive for nausea. Negative for abdominal pain and vomiting.  Endocrine: Negative.   Genitourinary: Negative.  Negative for dysuria.    Musculoskeletal: Negative.   Skin: Negative.   Neurological: Negative.     Allergies as of 05/24/2016   No Known Allergies     Medication List       Accurate as of 05/24/16  1:09 PM. Always use your most recent med list.          ondansetron 4 MG tablet Commonly known as:  ZOFRAN Take 1 tablet (4 mg total) by mouth every 8 (eight) hours as needed for nausea or vomiting.   prenatal vitamin w/FE, FA 29-1 MG Chew chewable tablet Chew 1 tablet by mouth daily at 12 noon.          Objective:    BP 111/62   Pulse 86   Temp 98.7 F (37.1 C) (Oral)   Ht 5\' 6"  (1.676 m)   Wt 239 lb (108.4 kg)   LMP 04/01/2016   BMI 38.58 kg/m   No Known Allergies  Physical Exam  Constitutional: She is oriented to person, place, and time. She appears well-developed and well-nourished.  HENT:  Head: Normocephalic and atraumatic.  Eyes: Conjunctivae and EOM are normal. Pupils are equal, round, and reactive to light.  Cardiovascular: Normal rate, regular rhythm, normal heart sounds and intact distal pulses.   Pulmonary/Chest: Effort normal and breath sounds normal.  Abdominal: Soft. Bowel sounds are normal.  Neurological: She is alert and oriented to person, place, and time. She has normal  reflexes.  Skin: Skin is warm and dry. No rash noted.  Psychiatric: She has a normal mood and affect. Her behavior is normal. Judgment and thought content normal.    Results for orders placed or performed in visit on 05/24/16  Pregnancy, urine  Result Value Ref Range   Preg Test, Ur Positive (A) Negative      Assessment & Plan:   1. Amenorrhea - Pregnancy, urine  2. Less than [redacted] weeks gestation of pregnancy - Ambulatory referral to Obstetrics / Gynecology  3. History of spontaneous abortion, currently pregnant - Ambulatory referral to Obstetrics / Gynecology  4. Endometriosis - Ambulatory referral to Obstetrics / Gynecology  5. Nausea - ondansetron (ZOFRAN) 4 MG tablet; Take 1 tablet (4  mg total) by mouth every 8 (eight) hours as needed for nausea or vomiting.  Dispense: 30 tablet; Refill: 1   Continue all other maintenance medications as listed above.  Follow up plan: Return if symptoms worsen or fail to improve.  Educational handout given for abdominal pain in pregnancy  Remus Loffler PA-C [facility] [address]  [place], [place] [address] [phone]   05/24/2016, 1:09 PM

## 2016-05-26 ENCOUNTER — Telehealth: Payer: Self-pay | Admitting: Physician Assistant

## 2016-05-30 ENCOUNTER — Telehealth: Payer: Self-pay | Admitting: Physician Assistant

## 2016-05-30 ENCOUNTER — Telehealth: Payer: Self-pay

## 2016-05-30 NOTE — Telephone Encounter (Signed)
I spoke with the pt and the office last week. Gave pt my direct number to call if she did not have an appointment by today  Tuesday 05/30/16/ Pt is scheduled

## 2016-05-30 NOTE — Telephone Encounter (Signed)
She has family planning medicaid, do I did not make an ortho referral.

## 2016-06-15 ENCOUNTER — Ambulatory Visit: Payer: Self-pay

## 2016-06-15 ENCOUNTER — Other Ambulatory Visit (HOSPITAL_COMMUNITY)
Admission: RE | Admit: 2016-06-15 | Discharge: 2016-06-15 | Disposition: A | Discharge status: Home / Self Care | Payer: Medicaid Other | Source: Ambulatory Visit | Attending: Obstetrics and Gynecology | Admitting: Obstetrics and Gynecology

## 2016-06-15 ENCOUNTER — Encounter: Payer: Self-pay | Admitting: Family Medicine

## 2016-06-15 ENCOUNTER — Encounter: Payer: Self-pay | Admitting: Obstetrics and Gynecology

## 2016-06-15 ENCOUNTER — Ambulatory Visit (INDEPENDENT_AMBULATORY_CARE_PROVIDER_SITE_OTHER): Payer: Medicaid Other | Admitting: Obstetrics and Gynecology

## 2016-06-15 VITALS — BP 110/54 | HR 72 | Wt 241.4 lb

## 2016-06-15 DIAGNOSIS — Z720 Tobacco use: Secondary | ICD-10-CM | POA: Diagnosis not present

## 2016-06-15 DIAGNOSIS — Z113 Encounter for screening for infections with a predominantly sexual mode of transmission: Secondary | ICD-10-CM

## 2016-06-15 DIAGNOSIS — Z3491 Encounter for supervision of normal pregnancy, unspecified, first trimester: Secondary | ICD-10-CM

## 2016-06-15 DIAGNOSIS — O3680X0 Pregnancy with inconclusive fetal viability, not applicable or unspecified: Secondary | ICD-10-CM

## 2016-06-15 DIAGNOSIS — O219 Vomiting of pregnancy, unspecified: Secondary | ICD-10-CM

## 2016-06-15 DIAGNOSIS — Z124 Encounter for screening for malignant neoplasm of cervix: Secondary | ICD-10-CM

## 2016-06-15 DIAGNOSIS — E669 Obesity, unspecified: Secondary | ICD-10-CM | POA: Diagnosis not present

## 2016-06-15 DIAGNOSIS — Z3490 Encounter for supervision of normal pregnancy, unspecified, unspecified trimester: Secondary | ICD-10-CM | POA: Insufficient documentation

## 2016-06-15 DIAGNOSIS — O99210 Obesity complicating pregnancy, unspecified trimester: Secondary | ICD-10-CM | POA: Diagnosis not present

## 2016-06-15 MED ORDER — PROMETHAZINE HCL 25 MG PO TABS: ORAL_TABLET | ORAL | 1 refills | Status: DC

## 2016-06-15 NOTE — Progress Notes (Signed)
Pt informed that the ultrasound is considered a limited OB ultrasound and is not intended to be a complete ultrasound exam.  Patient also informed that the ultrasound is not being completed with the intent of assessing for fetal or placental anomalies or any pelvic abnormalities.  Explained that the purpose of today's ultrasound is to assess for viability and approximate gestational age.  Patient acknowledges the purpose of the exam and the limitations of the study.    Single IUP FHR - 186 bpm per M-mode CRL - 2.07 cm (8w 4d)

## 2016-06-15 NOTE — Progress Notes (Deleted)
New OB Note  06/15/2016   Clinic: [facility]  Chief Complaint: Initial prenatal visit  Transfer of Care Patient: Yes  History of Present Illness: Rachel Gregory is a 26 y.o. G2P0010 @ 8 weeks 4 days (EDC 04/16/16, based on Patient's last menstrual period was 04/01/2016 (approximate).).  Preg complicated by has History of spontaneous abortion, currently pregnant; Endometriosis; Tobacco abuse; Asthma; Supervision of low-risk pregnancy, first trimester; Obesity in pregnancy; and Obesity (BMI 35.0-39.9 without comorbidity) on her problem list.  Had early spontaneous abortion of twin gestation in January 2017.  Patient reports no vaginal bleeding, discharge, dysuria, hematuria, blood in stool, constipation, diarrhea, fever, chills, SOB, abdominal pain, headache.  Notes sharp "twinges" of pelvic pain intermittently that occur near hips and broad ligaments.  Nausea and vomiting in mornings currently, not losing weight, able to keep down fluids and solids as day progresses.  Patient smokes 5 cigarettes to 1/2 pack per day and is interested in quitting.  Family history of ovarian cancer in mother (early 16's, total hysterectomy plus medical treatment), gestational diabetes in mother.  Any events prior to today's visit: No Her periods were: Irregular, painful due to endometriosis She was using no method when she conceived. History of Depo shot use from age [age]. She has Positive signs or symptoms of nausea/vomiting of pregnancy. She has Negative signs or symptoms of miscarriage or preterm labor She identifies Negative Zika risk factors for her and her partner On any different medications around the time she conceived/early pregnancy: No History of varicella: Did not ask***  ROS: A 12-point review of systems was performed and negative, except as stated in the above HPI.  OBGYN History: As per HPI. OB History  Gravida Para Term Preterm AB Living  2       1    SAB TAB Ectopic Multiple  Live Births  1            # Outcome Date GA Lbr Len/2nd Weight Sex Delivery Anes PTL Lv  2 Current           1 SAB 03/2015              Any issues with any prior pregnancies: {YES/NO/NOT APPLICABLE:20182} Prior children are healthy, doing well, and without any problems or issues: {YES/NO/NOT APPLICABLE:20182} History of pap smears: {yes no free text:20080::"Yes"}. Last pap smear *** and results were *** History of STIs: {yes no free text:20080::"Yes"}   Past Medical History: Past Medical History:  Diagnosis Date  . ADHD (attention deficit hyperactivity disorder)   . Allergy   . Asthma   . Bipolar 1 disorder (HCC)   . Depression   . Endometriosis   . Plantar fasciitis of right foot     Past Surgical History: Past Surgical History:  Procedure Laterality Date  . DILATION AND CURETTAGE OF UTERUS  04/2015   spontaneous abortion    Family History:  Family History  Problem Relation Age of Onset  . Miscarriages / [place] Mother   . Arthritis Father   . Miscarriages / Stillbirths Maternal Aunt   . Birth defects Maternal Uncle     congenital heart defect  . Birth defects Paternal Uncle     spina bifida  . Miscarriages / Stillbirths Maternal Grandmother   . Miscarriages / Stillbirths Paternal Grandmother   ?ov cancer in mom*** She {Actions; denies-reports:120008} any female cancers, bleeding or blood clotting disorders.  She {Actions; denies-reports:120008} any history of mental retardation, birth defects or genetic disorders in  her or the FOB's history  Social History:  Social History   Social History  . Marital status: Single    Spouse name: N/A  . Number of children: N/A  . Years of education: N/A   Occupational History  . Not on file.   Social History Main Topics  . Smoking status: Current Every Day Smoker  . Smokeless tobacco: Never Used  . Alcohol use Yes     Comment: occ not since pregnant  . Drug use: No  . Sexual activity: Yes    Birth control/  protection: None   Other Topics Concern  . Not on file   Social History Narrative  . No narrative on file   Any pets in the household: {YES NO:22349} ***  Allergy: No Known Allergies  Health Maintenance:  Mammogram Up to Date: {YES/NO/NOT APPLICABLE:20182}  Current Outpatient Medications: *** @  Physical Exam:   BP (!) 110/54   Pulse 72   Wt 241 lb 6.4 oz (109.5 kg)   LMP 04/01/2016 (Approximate)   BMI 38.96 kg/m  Body mass index is 38.96 kg/m. Fundal height: 8 FHTs: ***  General appearance: Well nourished, well developed female in no acute distress.  Neck:  Supple, normal appearance, and no thyromegaly  Cardiovascular: {EXAM; ZOXWR:60454} Respiratory:  Clear to auscultation bilateral. Normal respiratory effort Abdomen: positive bowel sounds and no masses, hernias; diffusely non tender to palpation, non distended Breasts: {pe breast exam:315056::"breasts appear normal, no suspicious masses, no skin or nipple changes or axillary nodes"}. Neuro/Psych:  Normal mood and affect.  Skin:  Warm and dry.  Lymphatic:  No inguinal lymphadenopathy.   Pelvic exam: is not limited by body habitus EGBUS: within normal limits, Vagina: within normal limits and with no blood in the vault, Cervix: normal appearing cervix without discharge or lesions, closed/long/high, Uterus:  {Desc; uterus-size & shape:16618}, and Adnexa:  normal adnexa  Laboratory: ***  Imaging:  ***  Assessment: ***  Plan: 1. Encounter to determine fetal viability of pregnancy, single or unspecified fetus *** - NT scan - US OB Limited  2. Encounter for supervision of normal pregnancy in first trimester, unspecified gravidity *** - Obstetric Panel, Including HIV - Cervicovaginal ancillary only - Culture, OB Urine - Cystic Fibrosis Mutation 97 - Hemoglobinopathy Evaluation  Problem list reviewed and updated.  Follow up in {numbers 0-4:31231} weeks.  The nature of [facility] with multiple MDs and other Advanced Practice Providers was explained to patient; also emphasized that residents, students are part of our team.  >50% of *** min visit spent on counseling and coordination of care.     Cornelia Copa MD Attending Center for [facility])

## 2016-06-16 DIAGNOSIS — O219 Vomiting of pregnancy, unspecified: Secondary | ICD-10-CM | POA: Insufficient documentation

## 2016-06-16 LAB — CERVICOVAGINAL ANCILLARY ONLY
CHLAMYDIA, DNA PROBE: NEGATIVE
NEISSERIA GONORRHEA: NEGATIVE

## 2016-06-16 NOTE — Progress Notes (Signed)
New OB Note  06/16/2016   Clinic: [facility]  Chief Complaint: NOB  Transfer of Care Patient: no  History of Present Illness: Rachel Gregory is a 26 y.o. G2P0010 @ 8/4 weeks (EDC 11/11, based on u/s today) Patient's last menstrual period was 04/01/2016 (approximate).  Preg complicated by has History of spontaneous abortion, currently pregnant; Endometriosis; Tobacco abuse; Supervision of low-risk pregnancy, first trimester; Obesity in pregnancy; and Obesity (BMI 35.0-39.9 without comorbidity) on her problem list.   Any events prior to today's visit: no Her periods were: irregular She was using no method when she conceived.  She has Positive signs or symptoms of nausea/vomiting of pregnancy.  Able to stay hydrated and keep down some foods though She has Negative signs or symptoms of miscarriage or preterm labor On any different medications around the time she conceived/early pregnancy: No   ROS: A 12-point review of systems was performed and negative, except as stated in the above HPI.  OBGYN History: As per HPI. OB History  Gravida Para Term Preterm AB Living  2       1    SAB TAB Ectopic Multiple Live Births  1            # Outcome Date GA Lbr Len/2nd Weight Sex Delivery Anes PTL Lv  2 Current           1 SAB 03/2015              Any issues with any prior pregnancies: not applicable Prior children are healthy, doing well, and without any problems or issues: not applicable History of pap smears: Unsure.  History of STIs: No   Past Medical History: Past Medical History:  Diagnosis Date  . ADHD (attention deficit hyperactivity disorder)   . Allergy   . Asthma   . Bipolar 1 disorder (HCC)   . Depression   . Endometriosis   . Plantar fasciitis of right foot     Past Surgical History: Past Surgical History:  Procedure Laterality Date  . DILATION AND CURETTAGE OF UTERUS  04/2015   spontaneous abortion    Family History:  Family History  Problem  Relation Age of Onset  . Miscarriages / [place] Mother   . Arthritis Father   . Miscarriages / Stillbirths Maternal Aunt   . Birth defects Maternal Uncle     congenital heart defect  . Birth defects Paternal Uncle     spina bifida  . Miscarriages / Stillbirths Maternal Grandmother   . Miscarriages / Stillbirths Paternal Grandmother    She denies any female cancers, bleeding or blood clotting disorders; she states mom may have had ovarian cancer in her 30s b/c she needed a hysterectomy. Patient unsure if she needed chemo She denies any history of mental retardation, birth defects or genetic disorders in her or the FOB's history  Social History:  Social History   Social History  . Marital status: Single    Spouse name: N/A  . Number of children: N/A  . Years of education: N/A   Occupational History  . Not on file.   Social History Main Topics  . Smoking status: Current Every Day Smoker  . Smokeless tobacco: Never Used  . Alcohol use Yes     Comment: occ not since pregnant  . Drug use: No  . Sexual activity: Yes    Birth control/ protection: None   Other Topics Concern  . Not on file   Social History Narrative  .  No narrative on file    Allergy: No Known Allergies  Health Maintenance:  Mammogram Up to Date: not applicable  Current Outpatient Medications: PNV  Physical Exam:   BP (!) 110/54   Pulse 72   Wt 241 lb 6.4 oz (109.5 kg)   LMP 04/01/2016 (Approximate)   BMI 38.96 kg/m  Body mass index is 38.96 kg/m.   Vag. Bleeding: None. Fundal height: not applicable FHTs: normal by bedside u/s  General appearance: Well nourished, well developed female in no acute distress.  Neck:  Supple, normal appearance, and no thyromegaly  Cardiovascular: S1, S2 normal, no murmur, rub or gallop, regular rate and rhythm Respiratory:  Clear to auscultation bilateral. Normal respiratory effort Abdomen: positive bowel sounds and no masses, hernias; diffusely non tender  to palpation, non distended Neuro/Psych:  Normal mood and affect.  Skin:  Warm and dry.  Lymphatic:  No inguinal lymphadenopathy.   Pelvic exam: is limited by body habitus EGBUS: within normal limits, Vagina: within normal limits and with no blood in the vault, Cervix: normal appearing cervix without discharge or lesions, closed/long/high, Uterus:  nonenlarged (difficult to palpate), and Adnexa:  normal adnexa and no mass, fullness, tenderness  Laboratory: None  Imaging:  SLIUP at 8/4 weeks by bedside u/s  Assessment: pt stable  Plan: 1. Encounter for supervision of normal pregnancy in first trimester, unspecified gravidity Routine care. Pt amenable to 1st trimester screen - Obstetric Panel, Including HIV - Cervicovaginal ancillary only - Culture, OB Urine - Cystic Fibrosis Mutation 97 - Hemoglobinopathy Evaluation - SMN1 Copy Number Analysis - Cytology - PAP - [place] MFM Fetal Nuchal Translucency; Future - Protein / Creatinine Ratio, Urine - TSH  2. Tobacco abuse Counseled on cessation  3. Obesity in pregnancy TSH, baseline pre-x labs and pt to come back for formal 2hr testing given strong FHx of DM2  4. N/V of pregnancy Advised on diclegis but she'd like to do PRN meds. Couldn't fill zofran b/c of cost so phenergan Rx given.   Problem list reviewed and updated.  Follow up in 3 weeks.  >50% of 25 min visit spent on counseling and coordination of care.     Cornelia Copa MD Attending Center for [facility])

## 2016-06-17 LAB — CULTURE, OB URINE

## 2016-06-17 LAB — URINE CULTURE, OB REFLEX

## 2016-06-21 LAB — CYSTIC FIBROSIS MUTATION 97: Interpretation: NOT DETECTED

## 2016-06-22 LAB — SMN1 COPY NUMBER ANALYSIS (SMA CARRIER SCREENING)

## 2016-06-22 LAB — OBSTETRIC PANEL, INCLUDING HIV
Antibody Screen: NEGATIVE
BASOS ABS: 0 10*3/uL (ref 0.0–0.2)
BASOS: 0 %
EOS (ABSOLUTE): 0.2 10*3/uL (ref 0.0–0.4)
Eos: 2 %
HEMATOCRIT: 38.3 % (ref 34.0–46.6)
HEMOGLOBIN: 13 g/dL (ref 11.1–15.9)
HEP B S AG: NEGATIVE
HIV Screen 4th Generation wRfx: NONREACTIVE
IMMATURE GRANULOCYTES: 0 %
Immature Grans (Abs): 0 10*3/uL (ref 0.0–0.1)
LYMPHS: 21 %
Lymphocytes Absolute: 1.7 10*3/uL (ref 0.7–3.1)
MCH: 28.3 pg (ref 26.6–33.0)
MCHC: 33.9 g/dL (ref 31.5–35.7)
MCV: 83 fL (ref 79–97)
MONOCYTES: 6 %
Monocytes Absolute: 0.5 10*3/uL (ref 0.1–0.9)
Neutrophils Absolute: 5.6 10*3/uL (ref 1.4–7.0)
Neutrophils: 71 %
Platelets: 190 10*3/uL (ref 150–379)
RBC: 4.59 x10E6/uL (ref 3.77–5.28)
RDW: 14.4 % (ref 12.3–15.4)
RPR: NONREACTIVE
Rh Factor: POSITIVE
Rubella Antibodies, IGG: 2.54 index (ref >0.99)
WBC: 8 10*3/uL (ref 3.4–10.8)

## 2016-06-22 LAB — PROTEIN / CREATININE RATIO, URINE
CREATININE, UR: 134.3 mg/dL
Protein, Ur: 12.5 mg/dL
Protein/Creat Ratio: 93 mg/g creat (ref 0–200)

## 2016-06-22 LAB — HEMOGLOBINOPATHY EVALUATION
FERRITIN: 24 ng/mL (ref 15–150)
HGB C: 0 %
HGB F QUANT: 0 % (ref 0.0–2.0)
HGB SOLUBILITY: NEGATIVE
Hgb A2 Quant: 2.1 % (ref 1.8–3.2)
Hgb A: 97.9 % (ref 96.4–98.8)
Hgb S: 0 %
Hgb Variant: 0 %

## 2016-06-22 LAB — TSH: TSH: 1.3 u[IU]/mL (ref 0.450–4.500)

## 2016-06-29 LAB — CYTOLOGY - PAP
DIAGNOSIS: NEGATIVE
Trichomonas: NEGATIVE

## 2016-07-05 ENCOUNTER — Other Ambulatory Visit: Payer: Self-pay

## 2016-07-07 ENCOUNTER — Other Ambulatory Visit: Payer: Self-pay

## 2016-07-07 DIAGNOSIS — Z3401 Encounter for supervision of normal first pregnancy, first trimester: Secondary | ICD-10-CM

## 2016-07-12 ENCOUNTER — Ambulatory Visit (INDEPENDENT_AMBULATORY_CARE_PROVIDER_SITE_OTHER): Payer: Medicaid Other | Admitting: Obstetrics and Gynecology

## 2016-07-12 VITALS — BP 118/59 | HR 75 | Wt 245.0 lb

## 2016-07-12 DIAGNOSIS — Z3491 Encounter for supervision of normal pregnancy, unspecified, first trimester: Secondary | ICD-10-CM

## 2016-07-12 DIAGNOSIS — O99212 Obesity complicating pregnancy, second trimester: Secondary | ICD-10-CM

## 2016-07-12 DIAGNOSIS — Z3492 Encounter for supervision of normal pregnancy, unspecified, second trimester: Secondary | ICD-10-CM

## 2016-07-12 DIAGNOSIS — O219 Vomiting of pregnancy, unspecified: Secondary | ICD-10-CM

## 2016-07-12 DIAGNOSIS — O99210 Obesity complicating pregnancy, unspecified trimester: Secondary | ICD-10-CM

## 2016-07-12 NOTE — Progress Notes (Signed)
Prenatal Visit Note Date: 07/12/2016 [facility]  Subjective:  Rachel Gregory is a 26 y.o. G2P0010 at [redacted]w[redacted]d being seen today for ongoing prenatal care.  She is currently monitored for the following issues for this low-risk pregnancy and has Endometriosis; Tobacco abuse; Supervision of low-risk pregnancy, first trimester; Obesity in pregnancy; Obesity (BMI 35.0-39.9 without comorbidity); and Nausea and vomiting of pregnancy, antepartum on her problem list.  Patient reports still with some nausea and vomiting (stable); phenergan doesn't help much.    . Vag. Bleeding: None.   . Denies leaking of fluid.   The following portions of the patient's history were reviewed and updated as appropriate: allergies, current medications, past family history, past medical history, past social history, past surgical history and problem list. Problem list updated.  Objective:   Vitals:   07/12/16 1247  BP: (!) 118/59  Pulse: 75  Weight: 245 lb (111.1 kg)    Fetal Status: Fetal Heart Rate (bpm): 152         General:  Alert, oriented and cooperative. Patient is in no acute distress.  Skin: Skin is warm and dry. No rash noted.   Cardiovascular: Normal heart rate noted  Respiratory: Normal respiratory effort, no problems with respiration noted  Abdomen: Soft, gravid, appropriate for gestational age. Pain/Pressure: Present     Pelvic:  Cervical exam deferred        Extremities: Normal range of motion.  Edema: None  Mental Status: Normal mood and affect. Normal behavior. Normal judgment and thought content.   Urinalysis:      Assessment and Plan:  Pregnancy: G2P0010 at [redacted]w[redacted]d  1. Encounter for supervision of low-risk pregnancy in second trimester Routine care. Has nt scan next week. Will set up for anatomy scan at 18-20wks Pt switched to PNV qhs and has helped with nausea/vomiting of pregnancy; weight stable - [place] MFM OB COMP + 14 WK; Future  2. Obesity in pregnancy 2hr  from last week collected but not back yet. Will follow up  Preterm labor symptoms and general obstetric precautions including but not limited to vaginal bleeding, contractions, leaking of fluid and fetal movement were reviewed in detail with the patient. Please refer to After Visit Summary for other counseling recommendations.  Return in about 4 weeks (around 08/09/2016) for rob.   [place] Bing, MD

## 2016-07-12 NOTE — Progress Notes (Signed)
Breastfeeding tip reviewed 

## 2016-07-13 ENCOUNTER — Telehealth: Payer: Self-pay | Admitting: *Deleted

## 2016-07-13 NOTE — Telephone Encounter (Signed)
Scheduled anatomy u/s for 6/18 @ 0845. Called patient and let her know. Understanding voiced.

## 2016-07-17 ENCOUNTER — Ambulatory Visit (HOSPITAL_COMMUNITY)
Admission: RE | Admit: 2016-07-17 | Discharge: 2016-07-17 | Disposition: A | Discharge status: Home / Self Care | Payer: Medicaid Other | Source: Ambulatory Visit | Attending: Physician Assistant | Admitting: Physician Assistant

## 2016-07-17 ENCOUNTER — Telehealth: Payer: Self-pay | Admitting: *Deleted

## 2016-07-17 ENCOUNTER — Ambulatory Visit (HOSPITAL_COMMUNITY)
Admission: RE | Admit: 2016-07-17 | Discharge: 2016-07-17 | Disposition: A | Discharge status: Home / Self Care | Payer: Medicaid Other | Source: Ambulatory Visit | Attending: Obstetrics and Gynecology | Admitting: Obstetrics and Gynecology

## 2016-07-17 ENCOUNTER — Encounter (HOSPITAL_COMMUNITY): Payer: Self-pay

## 2016-07-17 DIAGNOSIS — O99212 Obesity complicating pregnancy, second trimester: Secondary | ICD-10-CM | POA: Insufficient documentation

## 2016-07-17 DIAGNOSIS — Z3682 Encounter for antenatal screening for nuchal translucency: Secondary | ICD-10-CM | POA: Insufficient documentation

## 2016-07-17 DIAGNOSIS — Z3A13 13 weeks gestation of pregnancy: Secondary | ICD-10-CM | POA: Insufficient documentation

## 2016-07-17 DIAGNOSIS — O99332 Smoking (tobacco) complicating pregnancy, second trimester: Secondary | ICD-10-CM | POA: Insufficient documentation

## 2016-07-17 DIAGNOSIS — Z3491 Encounter for supervision of normal pregnancy, unspecified, first trimester: Secondary | ICD-10-CM | POA: Diagnosis present

## 2016-07-17 LAB — GLUCOSE TOLERANCE, 2 HOURS W/ 1HR
GLUCOSE, 2 HOUR: 63 mg/dL — AB (ref 65–152)
Glucose, 1 hour: 89 mg/dL (ref 65–179)
Glucose, Fasting: 84 mg/dL (ref 65–91)

## 2016-07-17 LAB — SPECIMEN STATUS REPORT

## 2016-07-17 NOTE — Telephone Encounter (Signed)
Called pt and left message that I am calling with test result information.  There is no emergency.  Please call back and state whether a detailed message can be left on voicemail. Pt needs to be informed that her 2hr GTT was normal (results reviewed by Dr. Vergie LivingPickens).  She will have this test repeated @ [redacted]wks gestation.

## 2016-07-17 NOTE — Telephone Encounter (Signed)
Pt informed of normal results of 2 hr gtt.

## 2016-07-19 ENCOUNTER — Other Ambulatory Visit: Payer: Self-pay

## 2016-07-21 ENCOUNTER — Encounter: Payer: Self-pay | Admitting: Family Medicine

## 2016-07-25 ENCOUNTER — Other Ambulatory Visit: Payer: Self-pay | Admitting: *Deleted

## 2016-07-25 DIAGNOSIS — Z3492 Encounter for supervision of normal pregnancy, unspecified, second trimester: Secondary | ICD-10-CM

## 2016-07-25 DIAGNOSIS — B379 Candidiasis, unspecified: Secondary | ICD-10-CM

## 2016-07-25 MED ORDER — TERCONAZOLE 0.4 % VA CREA: TOPICAL_CREAM | VAGINAL | 0 refills | Status: DC

## 2016-08-08 ENCOUNTER — Encounter: Payer: Medicaid Other | Admitting: Family Medicine

## 2016-08-10 ENCOUNTER — Encounter: Payer: Self-pay | Admitting: Obstetrics and Gynecology

## 2016-08-23 ENCOUNTER — Ambulatory Visit (INDEPENDENT_AMBULATORY_CARE_PROVIDER_SITE_OTHER): Payer: Medicaid Other | Admitting: Obstetrics and Gynecology

## 2016-08-23 VITALS — BP 128/58 | HR 76 | Wt 248.0 lb

## 2016-08-23 DIAGNOSIS — Z3492 Encounter for supervision of normal pregnancy, unspecified, second trimester: Secondary | ICD-10-CM

## 2016-08-23 NOTE — Progress Notes (Signed)
Ultrasound schedule for 06/18

## 2016-08-23 NOTE — Patient Instructions (Signed)

## 2016-08-23 NOTE — Progress Notes (Signed)
   PRENATAL VISIT NOTE  Subjective:  Arvid RightBridget M Minix is a 26 y.o. G2P0010 at 3223w3d being seen today for ongoing prenatal care.  She is currently monitored for the following issues for this low-risk pregnancy and has Endometriosis; Tobacco abuse; Supervision of low-risk pregnancy, first trimester; Obesity in pregnancy; Obesity (BMI 35.0-39.9 without comorbidity); and Nausea and vomiting of pregnancy, antepartum on her problem list.  Patient reports nausea and vomiting.  She reports the Phenergan "does not help". She states, "throws it up every time she takes it".  She reports vomiting at least 2-3 times per day.  Movement: Absent. Denies leaking of fluid.   The following portions of the patient's history were reviewed and updated as appropriate: allergies, current medications, past family history, past medical history, past social history, past surgical history and problem list. Problem list updated.  Objective:   Vitals:   08/23/16 1100  BP: (!) 128/58  Pulse: 76  Weight: 248 lb (112.5 kg)    Fetal Status: Fetal Heart Rate (bpm): 143   Movement: Absent   S=D  General:  Alert, oriented and cooperative. Patient is in no acute distress.  Skin: Skin is warm and dry. No rash noted.   Cardiovascular: Normal heart rate noted  Respiratory: Normal respiratory effort, no problems with respiration noted  Abdomen: Soft, gravid, appropriate for gestational age. Pain/Pressure: Present     Pelvic:  Cervical exam deferred        Extremities: Normal range of motion.  Edema: None  Mental Status: Normal mood and affect. Normal behavior. Normal judgment and thought content.   Assessment and Plan:  Pregnancy: G2P0010 at 4323w3d  Encounter for supervision of low-risk pregnancy in second trimester - Discussed normal variation of N/V still occurring in pregnancy; comfort measures given to help relieve N/V - Advised to have protein snack at bedtime - Keep scheduled appointment for OB Complete U/S on  08/28/2016  Preterm labor symptoms and general obstetric precautions including but not limited to vaginal bleeding, contractions, leaking of fluid and fetal movement were reviewed in detail with the patient. Please refer to After Visit Summary for other counseling recommendations.  Return in about 4 weeks (around 09/20/2016) for Return OB visit.   Raelyn Moraolitta Macenzie Burford, CNM

## 2016-08-25 ENCOUNTER — Emergency Department
Admission: EM | Admit: 2016-08-25 | Discharge: 2016-08-25 | Discharge status: Against Medical Advice | Payer: Medicaid Other

## 2016-08-25 NOTE — ED Notes (Signed)
Called in the WR x2 with no answer.

## 2016-08-25 NOTE — ED Notes (Signed)
Pt called in the WRx3 with no response 

## 2016-08-25 NOTE — ED Notes (Signed)
Pt called in the WR with no answer, will attempt again within 

## 2016-08-28 ENCOUNTER — Telehealth: Payer: Self-pay | Admitting: Emergency Medicine

## 2016-08-28 ENCOUNTER — Other Ambulatory Visit: Payer: Self-pay | Admitting: Obstetrics and Gynecology

## 2016-08-28 ENCOUNTER — Ambulatory Visit (HOSPITAL_COMMUNITY)
Admission: RE | Admit: 2016-08-28 | Discharge: 2016-08-28 | Disposition: A | Discharge status: Home / Self Care | Payer: Medicaid Other | Source: Ambulatory Visit | Attending: Obstetrics and Gynecology | Admitting: Obstetrics and Gynecology

## 2016-08-28 DIAGNOSIS — Z3A19 19 weeks gestation of pregnancy: Secondary | ICD-10-CM | POA: Diagnosis not present

## 2016-08-28 DIAGNOSIS — Z3492 Encounter for supervision of normal pregnancy, unspecified, second trimester: Secondary | ICD-10-CM | POA: Diagnosis present

## 2016-08-28 DIAGNOSIS — O321XX0 Maternal care for breech presentation, not applicable or unspecified: Secondary | ICD-10-CM | POA: Diagnosis not present

## 2016-08-28 NOTE — Telephone Encounter (Signed)
Called patient due to lwot to inquire about condition and follow up plans. Left message.   

## 2016-09-20 ENCOUNTER — Encounter: Payer: Medicaid Other | Admitting: Advanced Practice Midwife

## 2016-09-20 ENCOUNTER — Telehealth: Payer: Self-pay | Admitting: Family Medicine

## 2016-09-20 NOTE — Telephone Encounter (Signed)
Called patient due to missed appointment. Left message informing patient of new scheduled appointment.

## 2016-09-27 ENCOUNTER — Encounter: Payer: Medicaid Other | Admitting: Certified Nurse Midwife

## 2016-10-02 ENCOUNTER — Ambulatory Visit (INDEPENDENT_AMBULATORY_CARE_PROVIDER_SITE_OTHER): Payer: Medicaid Other | Admitting: Certified Nurse Midwife

## 2016-10-02 VITALS — BP 119/60 | HR 87 | Wt 250.4 lb

## 2016-10-02 DIAGNOSIS — Z3491 Encounter for supervision of normal pregnancy, unspecified, first trimester: Secondary | ICD-10-CM

## 2016-10-02 DIAGNOSIS — O2600 Excessive weight gain in pregnancy, unspecified trimester: Secondary | ICD-10-CM | POA: Insufficient documentation

## 2016-10-02 DIAGNOSIS — O2602 Excessive weight gain in pregnancy, second trimester: Secondary | ICD-10-CM

## 2016-10-02 DIAGNOSIS — O219 Vomiting of pregnancy, unspecified: Secondary | ICD-10-CM

## 2016-10-02 DIAGNOSIS — Z6841 Body Mass Index (BMI) 40.0 and over, adult: Secondary | ICD-10-CM

## 2016-10-02 DIAGNOSIS — Z720 Tobacco use: Secondary | ICD-10-CM

## 2016-10-02 MED ORDER — VITAMIN B-6 50 MG PO TABS: 50.0000 mg | ORAL_TABLET | Freq: Three times a day (TID) | ORAL | 1 refills | Status: DC | PRN

## 2016-10-02 MED ORDER — DOXYLAMINE SUCCINATE (SLEEP) 25 MG PO TABS: 25.0000 mg | ORAL_TABLET | Freq: Every day | ORAL | 0 refills | Status: DC

## 2016-10-02 MED ORDER — DOXYLAMINE SUCCINATE (SLEEP) 25 MG PO TABS: 25.0000 mg | ORAL_TABLET | Freq: Every day | ORAL | 1 refills | Status: DC

## 2016-10-02 MED ORDER — PRENATAL VITAMINS 0.8 MG PO TABS: 1.0000 | ORAL_TABLET | Freq: Every day | ORAL | 12 refills | Status: DC

## 2016-10-02 MED ORDER — PRENATAL 27-0.8 MG PO TABS: 1.0000 | ORAL_TABLET | Freq: Every day | ORAL | 12 refills | Status: DC

## 2016-10-02 NOTE — Progress Notes (Signed)
Subjective:  Rachel Gregory is a 26 y.o. G2P0010 at 7721w1d being seen today for ongoing prenatal care.  She is currently monitored for the following issues for this low-risk pregnancy and has Endometriosis; Tobacco abuse; Supervision of low-risk pregnancy; Obesity in pregnancy; Obesity (BMI 35.0-39.9 without comorbidity); Nausea and vomiting of pregnancy, antepartum; and Excessive weight gain in pregnancy on her problem list.  Patient reports no complaints.  Contractions: Not present. Vag. Bleeding: None.  Movement: Present. Denies leaking of fluid.   The following portions of the patient's history were reviewed and updated as appropriate: allergies, current medications, past family history, past medical history, past social history, past surgical history and problem list. Problem list updated.  Objective:   Vitals:   10/02/16 1417  BP: 119/60  Pulse: 87  Weight: 250 lb 6.4 oz (113.6 kg)    Fetal Status: Fetal Heart Rate (bpm): 153 Fundal Height: 25 cm Movement: Present     General:  Alert, oriented and cooperative. Patient is in no acute distress.  Skin: Skin is warm and dry. No rash noted.   Cardiovascular: Normal heart rate noted  Respiratory: Normal respiratory effort, no problems with respiration noted  Abdomen: Soft, gravid, appropriate for gestational age. Pain/Pressure: Present     Pelvic: Vag. Bleeding: None     Cervical exam deferred        Extremities: Normal range of motion.  Edema: Trace  Mental Status: Normal mood and affect. Normal behavior. Normal judgment and thought content.   Urinalysis:      Assessment and Plan:  Pregnancy: G2P0010 at 9121w1d  1. Supervision of low-risk pregnancy, first trimester - Prenatal Multivit-Min-Fe-FA (PRENATAL VITAMINS) 0.8 MG tablet; Take 1 tablet by mouth daily.  Dispense: 30 tablet; Refill: 12 - US MFM OB FOLLOW UP; Future  2. Tobacco abuse - willing to quit - 1-800-quit line  3. Nausea and vomiting of pregnancy, antepartum -  doxylamine, Sleep, (UNISOM) 25 MG tablet; Take 1 tablet (25 mg total) by mouth at bedtime.  Dispense: 30 tablet; Refill: 0 - pyridOXINE (VITAMIN B-6) 50 MG tablet; Take 1 tablet (50 mg total) by mouth 3 (three) times daily as needed.  Dispense: 90 tablet; Refill: 1  4. Excessive weight gain during pregnancy in second trimester - Eliminate sugars (drinks mostly pepsi and eats snickers) - Eliminate desserts (cookies, cakes, candy) - Eat smaller more frequent meals and snacks - Add protein to each meal and snack - Add exercise (walking) 4-5 times a week   Preterm labor symptoms and general obstetric precautions including but not limited to vaginal bleeding, contractions, leaking of fluid and fetal movement were reviewed in detail with the patient. Please refer to After Visit Summary for other counseling recommendations.  Return in about 4 weeks (around 10/30/2016).   Donette LarryBhambri, Karthika Glasper, CNM

## 2016-10-02 NOTE — Patient Instructions (Signed)

## 2016-10-07 ENCOUNTER — Encounter: Payer: Self-pay | Admitting: Emergency Medicine

## 2016-10-07 ENCOUNTER — Emergency Department
Admission: EM | Admit: 2016-10-07 | Discharge: 2016-10-07 | Disposition: A | Discharge status: Home / Self Care | Payer: Medicaid Other | Attending: Emergency Medicine | Admitting: Emergency Medicine

## 2016-10-07 DIAGNOSIS — Z3A25 25 weeks gestation of pregnancy: Secondary | ICD-10-CM | POA: Insufficient documentation

## 2016-10-07 DIAGNOSIS — Z79899 Other long term (current) drug therapy: Secondary | ICD-10-CM | POA: Insufficient documentation

## 2016-10-07 DIAGNOSIS — O9952 Diseases of the respiratory system complicating childbirth: Secondary | ICD-10-CM | POA: Insufficient documentation

## 2016-10-07 DIAGNOSIS — F17211 Nicotine dependence, cigarettes, in remission: Secondary | ICD-10-CM | POA: Insufficient documentation

## 2016-10-07 DIAGNOSIS — J029 Acute pharyngitis, unspecified: Secondary | ICD-10-CM

## 2016-10-07 DIAGNOSIS — O26892 Other specified pregnancy related conditions, second trimester: Secondary | ICD-10-CM | POA: Insufficient documentation

## 2016-10-07 DIAGNOSIS — O99332 Smoking (tobacco) complicating pregnancy, second trimester: Secondary | ICD-10-CM | POA: Diagnosis not present

## 2016-10-07 DIAGNOSIS — J45909 Unspecified asthma, uncomplicated: Secondary | ICD-10-CM | POA: Insufficient documentation

## 2016-10-07 LAB — POCT RAPID STREP A: Streptococcus, Group A Screen (Direct): NEGATIVE

## 2016-10-07 NOTE — ED Provider Notes (Signed)
[facility] Emergency Department Provider Note   ____________________________________________   First MD Initiated Contact with Patient 10/07/16 1350     (approximate)  I have reviewed the triage vital signs and the nursing notes.   HISTORY  Chief Complaint Sore Throat   HPI Rachel Gregory is a 26 y.o. female is here with complaint of sore throat for 3 days. Patient states she hasn't used over-the-counter preparations such as Chloraseptic throat spray and salt water gargles without any relief. She is unaware of any fever or chills. Currently she is [redacted] weeks pregnant and has not had any difficulty with her pregnancy. [place].   Past Medical History:  Diagnosis Date  . ADHD (attention deficit hyperactivity disorder)   . Allergy   . Asthma   . Bipolar 1 disorder (HCC)   . Depression   . Endometriosis   . Plantar fasciitis of right foot     Patient Active Problem List   Diagnosis Date Noted  . Excessive weight gain in pregnancy 10/02/2016  . Nausea and vomiting of pregnancy, antepartum 06/16/2016  . Tobacco abuse 06/15/2016  . Supervision of low-risk pregnancy 06/15/2016  . Obesity in pregnancy 06/15/2016  . Obesity (BMI 35.0-39.9 without comorbidity) 06/15/2016  . Endometriosis 05/24/2016    Past Surgical History:  Procedure Laterality Date  . DILATION AND CURETTAGE OF UTERUS  04/2015   spontaneous abortion    Prior to Admission medications   Medication Sig Start Date End Date Taking? Authorizing Provider  doxylamine, Sleep, (UNISOM) 25 MG tablet Take 1 tablet (25 mg total) by mouth at bedtime. May use 1/2-1 tablet 10/02/16   Donette LarryBhambri, Melanie, CNM  ondansetron (ZOFRAN) 4 MG tablet Take 1 tablet (4 mg total) by mouth every 8 (eight) hours as needed for nausea or vomiting. Patient not taking: Reported on 06/15/2016 05/24/16   Remus LofflerJones, Angel S, PA-C  Prenatal Multivit-Min-Fe-FA (PRENATAL VITAMINS) 0.8 MG tablet Take 1  tablet by mouth daily. 10/02/16   Donette LarryBhambri, Melanie, CNM  Prenatal Vit-Fe Fumarate-FA (MULTIVITAMIN-PRENATAL) 27-0.8 MG TABS tablet Take 1 tablet by mouth daily at 12 noon. 10/02/16   Donette LarryBhambri, Melanie, CNM  pyridOXINE (VITAMIN B-6) 50 MG tablet Take 1 tablet (50 mg total) by mouth 3 (three) times daily as needed. 10/02/16   Donette LarryBhambri, Melanie, CNM    Allergies Patient has no known allergies.  Family History  Problem Relation Age of Onset  . Miscarriages / [place] Mother   . Arthritis Father   . Miscarriages / Stillbirths Maternal Aunt   . Birth defects Maternal Uncle        congenital heart defect  . Birth defects Paternal Uncle        spina bifida  . Miscarriages / Stillbirths Maternal Grandmother   . Miscarriages / Stillbirths Paternal Grandmother     Social History Social History  Substance Use Topics  . Smoking status: Current Every Day Smoker    Packs/day: 0.50  . Smokeless tobacco: Never Used  . Alcohol use Yes     Comment: occ not since pregnant    Review of Systems Constitutional: No fever/chills Eyes: No visual changes. ENT: Positive sore throat. Cardiovascular: Denies chest pain. Respiratory: Denies shortness of breath. Rare nonproductive cough. Musculoskeletal: Negative for back pain. Skin: Negative for rash. Neurological: Negative for headaches, focal weakness or numbness.   ____________________________________________   PHYSICAL EXAM:  VITAL SIGNS: ED Triage Vitals  Enc Vitals Group     BP 10/07/16 1328 130/60  Pulse Rate 10/07/16 1328 80     Resp --      Temp 10/07/16 1328 97.8 F (36.6 C)     Temp Source 10/07/16 1328 Oral     SpO2 10/07/16 1328 99 %     Weight 10/07/16 1328 250 lb (113.4 kg)     Height 10/07/16 1328 5\' 6"  (1.676 m)     Head Circumference --      Peak Flow --      Pain Score 10/07/16 1325 6     Pain Loc --      Pain Edu? --      Excl. in GC? --     Constitutional: Alert and oriented. Well appearing and in no acute  distress. Eyes: Conjunctivae are normal. Head: Atraumatic. Nose: Mild congestion/no rhinnorhea.   EACs and TMs are clear bilaterally. Mouth/Throat: Mucous membranes are moist.  Oropharynx non-erythematous. No exudate is noted. Mild posterior drainage noted. Neck: No stridor.   Hematological/Lymphatic/Immunilogical: No cervical lymphadenopathy. Cardiovascular: Normal rate, regular rhythm. Grossly normal heart sounds.  Good peripheral circulation. Respiratory: Normal respiratory effort.  No retractions. Lungs CTAB. Musculoskeletal: Moves upper and lower extremities without any difficulty and normal gait was noted. Neurologic:  Normal speech and language. No gross focal neurologic deficits are appreciated.  Skin:  Skin is warm, dry and intact. No rash noted. Psychiatric: Mood and affect are normal. Speech and behavior are normal.  ____________________________________________   LABS (all labs ordered are listed, but only abnormal results are displayed)  Labs Reviewed  CULTURE, GROUP A STREP [facility]  POCT RAPID STREP A    PROCEDURES  Procedure(s) performed: None  Procedures  Critical Care performed: No  ____________________________________________   INITIAL IMPRESSION / ASSESSMENT AND PLAN / ED COURSE  Pertinent labs & imaging results that were available during my care of the patient were reviewed by me and considered in my medical decision making (see chart for details).  Patient will continue using salt water gargles. She'll increase fluids. Take Tylenol sparingly as needed for throat pain. She will call her OB[place]GYN in [place] to see if Sudafed is acceptable for her posterior drainage and mild nasal congestion. Until then she can also use saline nose spray as needed. Strep test was negative and culture was sent.    ___________________________________________   FINAL CLINICAL IMPRESSION(S) / ED DIAGNOSES  Final diagnoses:  Pharyngitis, unspecified etiology      NEW  MEDICATIONS STARTED DURING THIS VISIT:  Current Discharge Medication List       Note:  This document was prepared using Dragon voice recognition software and may include unintentional dictation errors.    Tommi RumpsSummers, Rhonda L, PA-C 10/07/16 1451    Nita SickleVeronese, Regan, MD 10/07/16 [phone]

## 2016-10-07 NOTE — ED Notes (Signed)
Pt verbalized understanding of discharge instructions. NAD at this time. 

## 2016-10-07 NOTE — ED Notes (Signed)
Patient states she was seen [facility] yesterday for fever and high blood pressure.  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  Arrives today, "just not feeling well".

## 2016-10-07 NOTE — ED Notes (Signed)
Patient denies pain and is resting comfortably.  

## 2016-10-07 NOTE — Discharge Instructions (Signed)
Increase fluids. Take Tylenol sparingly if needed for throat pain. You may use saline nose spray to decrease posterior drainage. Call your OB[place]GYN in [place] to see if Sudafed over-the-counter is acceptable. Continue saltwater gargles as needed for throat pain.

## 2016-10-07 NOTE — ED Triage Notes (Signed)
States sore throat x 3 days. States [redacted] weeks pregnant with no untoward symptoms related to pregnancy.

## 2016-10-10 ENCOUNTER — Ambulatory Visit (HOSPITAL_COMMUNITY)
Admission: RE | Admit: 2016-10-10 | Discharge: 2016-10-10 | Disposition: A | Discharge status: Home / Self Care | Payer: Medicaid Other | Source: Ambulatory Visit | Attending: Certified Nurse Midwife | Admitting: Certified Nurse Midwife

## 2016-10-10 ENCOUNTER — Other Ambulatory Visit: Payer: Self-pay | Admitting: Certified Nurse Midwife

## 2016-10-10 DIAGNOSIS — IMO0002 Reserved for concepts with insufficient information to code with codable children: Secondary | ICD-10-CM

## 2016-10-10 DIAGNOSIS — O99332 Smoking (tobacco) complicating pregnancy, second trimester: Secondary | ICD-10-CM | POA: Insufficient documentation

## 2016-10-10 DIAGNOSIS — O2602 Excessive weight gain in pregnancy, second trimester: Secondary | ICD-10-CM

## 2016-10-10 DIAGNOSIS — E669 Obesity, unspecified: Secondary | ICD-10-CM | POA: Insufficient documentation

## 2016-10-10 DIAGNOSIS — Z3A25 25 weeks gestation of pregnancy: Secondary | ICD-10-CM

## 2016-10-10 DIAGNOSIS — Z6839 Body mass index (BMI) 39.0-39.9, adult: Secondary | ICD-10-CM | POA: Diagnosis not present

## 2016-10-10 DIAGNOSIS — O99212 Obesity complicating pregnancy, second trimester: Secondary | ICD-10-CM

## 2016-10-10 DIAGNOSIS — Z0489 Encounter for examination and observation for other specified reasons: Secondary | ICD-10-CM

## 2016-10-10 DIAGNOSIS — Z3492 Encounter for supervision of normal pregnancy, unspecified, second trimester: Secondary | ICD-10-CM

## 2016-10-21 ENCOUNTER — Encounter: Payer: Self-pay | Admitting: Certified Nurse Midwife

## 2016-10-21 DIAGNOSIS — Z3490 Encounter for supervision of normal pregnancy, unspecified, unspecified trimester: Secondary | ICD-10-CM

## 2016-10-25 ENCOUNTER — Other Ambulatory Visit: Payer: Self-pay | Admitting: Obstetrics and Gynecology

## 2016-10-25 DIAGNOSIS — Z3490 Encounter for supervision of normal pregnancy, unspecified, unspecified trimester: Secondary | ICD-10-CM

## 2016-10-25 DIAGNOSIS — Z3491 Encounter for supervision of normal pregnancy, unspecified, first trimester: Secondary | ICD-10-CM

## 2016-10-25 MED ORDER — PRENATAL 27-0.8 MG PO TABS: 1.0000 | ORAL_TABLET | Freq: Every day | ORAL | 12 refills | Status: DC

## 2016-10-26 MED ORDER — PRENATAL VITAMINS 0.8 MG PO TABS: 1.0000 | ORAL_TABLET | Freq: Every day | ORAL | 12 refills | Status: DC

## 2016-10-31 ENCOUNTER — Ambulatory Visit (INDEPENDENT_AMBULATORY_CARE_PROVIDER_SITE_OTHER): Payer: Medicaid Other | Admitting: Obstetrics and Gynecology

## 2016-10-31 VITALS — BP 118/47 | HR 66 | Wt 247.1 lb

## 2016-10-31 DIAGNOSIS — Z23 Encounter for immunization: Secondary | ICD-10-CM | POA: Diagnosis not present

## 2016-10-31 DIAGNOSIS — Z720 Tobacco use: Secondary | ICD-10-CM | POA: Diagnosis not present

## 2016-10-31 DIAGNOSIS — O219 Vomiting of pregnancy, unspecified: Secondary | ICD-10-CM | POA: Diagnosis not present

## 2016-10-31 DIAGNOSIS — O2603 Excessive weight gain in pregnancy, third trimester: Secondary | ICD-10-CM

## 2016-10-31 DIAGNOSIS — Z3492 Encounter for supervision of normal pregnancy, unspecified, second trimester: Secondary | ICD-10-CM | POA: Diagnosis present

## 2016-10-31 MED ORDER — ONDANSETRON 4 MG PO TBDP: 4.0000 mg | ORAL_TABLET | Freq: Three times a day (TID) | ORAL | 0 refills | Status: DC | PRN

## 2016-10-31 NOTE — Patient Instructions (Signed)
Third Trimester of Pregnancy The third trimester is from week 28 through week 40 (months 7 through 9). The third trimester is a time when the unborn baby (fetus) is growing rapidly. At the end of the ninth month, the fetus is about 20 inches in length and weighs 6-10 pounds. Body changes during your third trimester Your body will continue to go through many changes during pregnancy. The changes vary from woman to woman. During the third trimester:  Your weight will continue to increase. You can expect to gain 25-35 pounds (11-16 kg) by the end of the pregnancy.  You may begin to get stretch marks on your hips, abdomen, and breasts.  You may urinate more often because the fetus is moving lower into your pelvis and pressing on your bladder.  You may develop or continue to have heartburn. This is caused by increased hormones that slow down muscles in the digestive tract.  You may develop or continue to have constipation because increased hormones slow digestion and cause the muscles that push waste through your intestines to relax.  You may develop hemorrhoids. These are swollen veins (varicose veins) in the rectum that can itch or be painful.  You may develop swollen, bulging veins (varicose veins) in your legs.  You may have increased body aches in the pelvis, back, or thighs. This is due to weight gain and increased hormones that are relaxing your joints.  You may have changes in your hair. These can include thickening of your hair, rapid growth, and changes in texture. Some women also have hair loss during or after pregnancy, or hair that feels dry or thin. Your hair will most likely return to normal after your baby is born.  Your breasts will continue to grow and they will continue to become tender. A yellow fluid (colostrum) may leak from your breasts. This is the first milk you are producing for your baby.  Your belly button may stick out.  You may notice more swelling in your hands,  face, or ankles.  You may have increased tingling or numbness in your hands, arms, and legs. The skin on your belly may also feel numb.  You may feel short of breath because of your expanding uterus.  You may have more problems sleeping. This can be caused by the size of your belly, increased need to urinate, and an increase in your body's metabolism.  You may notice the fetus "dropping," or moving lower in your abdomen (lightening).  You may have increased vaginal discharge.  You may notice your joints feel loose and you may have pain around your pelvic bone.  What to expect at prenatal visits You will have prenatal exams every 2 weeks until week 36. Then you will have weekly prenatal exams. During a routine prenatal visit:  You will be weighed to make sure you and the baby are growing normally.  Your blood pressure will be taken.  Your abdomen will be measured to track your baby's growth.  The fetal heartbeat will be listened to.  Any test results from the previous visit will be discussed.  You may have a cervical check near your due date to see if your cervix has softened or thinned (effaced).  You will be tested for Group B streptococcus. This happens between 35 and 37 weeks.  Your health care provider may ask you:  What your birth plan is.  How you are feeling.  If you are feeling the baby move.  If you have had   any abnormal symptoms, such as leaking fluid, bleeding, severe headaches, or abdominal cramping.  If you are using any tobacco products, including cigarettes, chewing tobacco, and electronic cigarettes.  If you have any questions.  Other tests or screenings that may be performed during your third trimester include:  Blood tests that check for low iron levels (anemia).  Fetal testing to check the health, activity level, and growth of the fetus. Testing is done if you have certain medical conditions or if there are problems during the  pregnancy.  Nonstress test (NST). This test checks the health of your baby to make sure there are no signs of problems, such as the baby not getting enough oxygen. During this test, a belt is placed around your belly. The baby is made to move, and its heart rate is monitored during movement.  What is false labor? False labor is a condition in which you feel small, irregular tightenings of the muscles in the womb (contractions) that usually go away with rest, changing position, or drinking water. These are called Braxton Hicks contractions. Contractions may last for hours, days, or even weeks before true labor sets in. If contractions come at regular intervals, become more frequent, increase in intensity, or become painful, you should see your health care provider. What are the signs of labor?  Abdominal cramps.  Regular contractions that start at 10 minutes apart and become stronger and more frequent with time.  Contractions that start on the top of the uterus and spread down to the lower abdomen and back.  Increased pelvic pressure and dull back pain.  A watery or bloody mucus discharge that comes from the vagina.  Leaking of amniotic fluid. This is also known as your "water breaking." It could be a slow trickle or a gush. Let your health care provider know if it has a color or strange odor. If you have any of these signs, call your health care provider right away, even if it is before your due date. Follow these instructions at home: Medicines  Follow your health care provider's instructions regarding medicine use. Specific medicines may be either safe or unsafe to take during pregnancy.  Take a prenatal vitamin that contains at least 600 micrograms (mcg) of folic acid.  If you develop constipation, try taking a stool softener if your health care provider approves. Eating and drinking  Eat a balanced diet that includes fresh fruits and vegetables, whole grains, good sources of protein  such as meat, eggs, or tofu, and low-fat dairy. Your health care provider will help you determine the amount of weight gain that is right for you.  Avoid raw meat and uncooked cheese. These carry germs that can cause birth defects in the baby.  If you have low calcium intake from food, talk to your health care provider about whether you should take a daily calcium supplement.  Eat four or five small meals rather than three large meals a day.  Limit foods that are high in fat and processed sugars, such as fried and sweet foods.  To prevent constipation: ? Drink enough fluid to keep your urine clear or pale yellow. ? Eat foods that are high in fiber, such as fresh fruits and vegetables, whole grains, and beans. Activity  Exercise only as directed by your health care provider. Most women can continue their usual exercise routine during pregnancy. Try to exercise for 30 minutes at least 5 days a week. Stop exercising if you experience uterine contractions.  Avoid heavy   lifting.  Do not exercise in extreme heat or humidity, or at high altitudes.  Wear low-heel, comfortable shoes.  Practice good posture.  You may continue to have sex unless your health care provider tells you otherwise. Relieving pain and discomfort  Take frequent breaks and rest with your legs elevated if you have leg cramps or low back pain.  Take warm sitz baths to soothe any pain or discomfort caused by hemorrhoids. Use hemorrhoid cream if your health care provider approves.  Wear a good support bra to prevent discomfort from breast tenderness.  If you develop varicose veins: ? Wear support pantyhose or compression stockings as told by your healthcare provider. ? Elevate your feet for 15 minutes, 3-4 times a day. Prenatal care  Write down your questions. Take them to your prenatal visits.  Keep all your prenatal visits as told by your health care provider. This is important. Safety  Wear your seat belt at  all times when driving.  Make a list of emergency phone numbers, including numbers for family, friends, the hospital, and police and fire departments. General instructions  Avoid cat litter boxes and soil used by cats. These carry germs that can cause birth defects in the baby. If you have a cat, ask someone to clean the litter box for you.  Do not travel far distances unless it is absolutely necessary and only with the approval of your health care provider.  Do not use hot tubs, steam rooms, or saunas.  Do not drink alcohol.  Do not use any products that contain nicotine or tobacco, such as cigarettes and e-cigarettes. If you need help quitting, ask your health care provider.  Do not use any medicinal herbs or unprescribed drugs. These chemicals affect the formation and growth of the baby.  Do not douche or use tampons or scented sanitary pads.  Do not cross your legs for long periods of time.  To prepare for the arrival of your baby: ? Take prenatal classes to understand, practice, and ask questions about labor and delivery. ? Make a trial run to the hospital. ? Visit the hospital and tour the maternity area. ? Arrange for maternity or paternity leave through employers. ? Arrange for family and friends to take care of pets while you are in the hospital. ? Purchase a rear-facing car seat and make sure you know how to install it in your car. ? Pack your hospital bag. ? Prepare the baby's nursery. Make sure to remove all pillows and stuffed animals from the baby's crib to prevent suffocation.  Visit your dentist if you have not gone during your pregnancy. Use a soft toothbrush to brush your teeth and be gentle when you floss. Contact a health care provider if:  You are unsure if you are in labor or if your water has broken.  You become dizzy.  You have mild pelvic cramps, pelvic pressure, or nagging pain in your abdominal area.  You have lower back pain.  You have persistent  nausea, vomiting, or diarrhea.  You have an unusual or bad smelling vaginal discharge.  You have pain when you urinate. Get help right away if:  Your water breaks before 37 weeks.  You have regular contractions less than 5 minutes apart before 37 weeks.  You have a fever.  You are leaking fluid from your vagina.  You have spotting or bleeding from your vagina.  You have severe abdominal pain or cramping.  You have rapid weight loss or weight gain.    You have shortness of breath with chest pain.  You notice sudden or extreme swelling of your face, hands, ankles, feet, or legs.  Your baby makes fewer than 10 movements in 2 hours.  You have severe headaches that do not go away when you take medicine.  You have vision changes. Summary  The third trimester is from week 28 through week 40, months 7 through 9. The third trimester is a time when the unborn baby (fetus) is growing rapidly.  During the third trimester, your discomfort may increase as you and your baby continue to gain weight. You may have abdominal, leg, and back pain, sleeping problems, and an increased need to urinate.  During the third trimester your breasts will keep growing and they will continue to become tender. A yellow fluid (colostrum) may leak from your breasts. This is the first milk you are producing for your baby.  False labor is a condition in which you feel small, irregular tightenings of the muscles in the womb (contractions) that eventually go away. These are called Braxton Hicks contractions. Contractions may last for hours, days, or even weeks before true labor sets in.  Signs of labor can include: abdominal cramps; regular contractions that start at 10 minutes apart and become stronger and more frequent with time; watery or bloody mucus discharge that comes from the vagina; increased pelvic pressure and dull back pain; and leaking of amniotic fluid. This information is not intended to replace advice  given to you by your health care provider. Make sure you discuss any questions you have with your health care provider. Document Released: 02/21/2001 Document Revised: 08/05/2015 Document Reviewed: 04/30/2012 Elsevier Interactive Patient Education  2017 [employer]  

## 2016-10-31 NOTE — Progress Notes (Signed)
Subjective:  Rachel Gregory is a 26 y.o. G2P0010 at [redacted]w[redacted]d being seen today for ongoing prenatal care.  She is currently monitored for the following issues for this low-risk pregnancy and has Endometriosis; Tobacco abuse; Supervision of low-risk pregnancy; Obesity in pregnancy; Obesity (BMI 35.0-39.9 without comorbidity); Nausea and vomiting of pregnancy, antepartum; and Excessive weight gain in pregnancy on her problem list.  Patient reports no complaints. Continues to have n/v but was  Unable to pick up any of the medications to help. She just throws up all the pills she takes. Contractions: Not present. Vag. Bleeding: None.  Movement: Present. Denies leaking of fluid.   The following portions of the patient's history were reviewed and updated as appropriate: allergies, current medications, past family history, past medical history, past social history, past surgical history and problem list. Problem list updated.  Objective:   Vitals:   10/31/16 0843 10/31/16 0846  BP:  (!) 118/47  Pulse:  66  Weight: 247 lb 1.6 oz (112.1 kg)     Fetal Status: Fetal Heart Rate (bpm): 161 Fundal Height: 31 cm Movement: Present     General:  Alert, oriented and cooperative. Patient is in no acute distress.  Skin: Skin is warm and dry. No rash noted.   Cardiovascular: Normal heart rate noted  Respiratory: Normal respiratory effort, no problems with respiration noted  Abdomen: Soft, gravid, appropriate for gestational age. Pain/Pressure: Absent     Pelvic: Vag. Bleeding: None     Cervical exam deferred        Extremities: Normal range of motion.  Edema: None  Mental Status: Normal mood and affect. Normal behavior. Normal judgment and thought content.    Assessment and Plan:  Pregnancy: G2P0010 at [redacted]w[redacted]d  1. Encounter for supervision of low-risk pregnancy in second trimester Continue routine prenatal care. 28wk labs today. Tdap given.  - Glucose Tolerance, 2 Hours w/1 Hour - HIV antibody - RPR -  CBC  2. Excessive weight gain during pregnancy in third trimester Patient continues to monitor her food intake and limiting sweets. She is down 3lbs from last visit.   3. Tobacco abuse Continues to smoke. Now about 3 cigs a day. Encouraged tobacco cessation.   4. Nausea and vomiting of pregnancy, antepartum Rx given for zofran-odt.    Preterm labor symptoms and general obstetric precautions including but not limited to vaginal bleeding, contractions, leaking of fluid and fetal movement were reviewed in detail with the patient. Please refer to After Visit Summary for other counseling recommendations.  Return in about 2 weeks (around 11/14/2016) for ob visit.   Caryl Ada, DO [facility], [facility] [place] 10/31/2016, 9:08 AM

## 2016-10-31 NOTE — Addendum Note (Signed)
Addended by: Cheree Ditto, DEMETRICE A on: 10/31/2016 10:14 AM   Modules accepted: Orders

## 2016-11-01 LAB — CBC
HEMATOCRIT: 37.8 % (ref 34.0–46.6)
HEMOGLOBIN: 12.2 g/dL (ref 11.1–15.9)
MCH: 28.2 pg (ref 26.6–33.0)
MCHC: 32.3 g/dL (ref 31.5–35.7)
MCV: 87 fL (ref 79–97)
Platelets: 175 10*3/uL (ref 150–379)
RBC: 4.33 x10E6/uL (ref 3.77–5.28)
RDW: 13.2 % (ref 12.3–15.4)
WBC: 8.9 10*3/uL (ref 3.4–10.8)

## 2016-11-01 LAB — GLUCOSE TOLERANCE, 2 HOURS W/ 1HR
GLUCOSE, 1 HOUR: 155 mg/dL (ref 65–179)
GLUCOSE, FASTING: 90 mg/dL (ref 65–91)
Glucose, 2 hour: 132 mg/dL (ref 65–152)

## 2016-11-01 LAB — HIV ANTIBODY (ROUTINE TESTING W REFLEX): HIV Screen 4th Generation wRfx: NONREACTIVE

## 2016-11-01 LAB — RPR: RPR: NONREACTIVE

## 2016-11-03 ENCOUNTER — Other Ambulatory Visit: Payer: Self-pay | Admitting: Obstetrics and Gynecology

## 2016-11-15 ENCOUNTER — Encounter: Payer: Self-pay | Admitting: Advanced Practice Midwife

## 2016-11-15 ENCOUNTER — Ambulatory Visit (INDEPENDENT_AMBULATORY_CARE_PROVIDER_SITE_OTHER): Payer: Medicaid Other | Admitting: Advanced Practice Midwife

## 2016-11-15 VITALS — BP 108/67 | HR 69 | Wt 253.0 lb

## 2016-11-15 DIAGNOSIS — Z3490 Encounter for supervision of normal pregnancy, unspecified, unspecified trimester: Secondary | ICD-10-CM

## 2016-11-15 DIAGNOSIS — Z3493 Encounter for supervision of normal pregnancy, unspecified, third trimester: Secondary | ICD-10-CM

## 2016-11-15 DIAGNOSIS — N949 Unspecified condition associated with female genital organs and menstrual cycle: Secondary | ICD-10-CM

## 2016-11-15 DIAGNOSIS — O219 Vomiting of pregnancy, unspecified: Secondary | ICD-10-CM

## 2016-11-15 MED ORDER — PYRIDOXINE HCL 500 MG PO TABS: 500.0000 mg | ORAL_TABLET | Freq: Two times a day (BID) | ORAL | 5 refills | Status: DC

## 2016-11-15 MED ORDER — PRENATAL 27-0.8 MG PO TABS: 1.0000 | ORAL_TABLET | Freq: Every day | ORAL | 12 refills | Status: DC

## 2016-11-15 MED ORDER — ONDANSETRON 4 MG PO TBDP: 4.0000 mg | ORAL_TABLET | Freq: Three times a day (TID) | ORAL | 0 refills | Status: DC | PRN

## 2016-11-15 MED ORDER — DOXYLAMINE SUCCINATE (SLEEP) 25 MG PO TABS: ORAL_TABLET | ORAL | 2 refills | Status: DC

## 2016-11-15 NOTE — Patient Instructions (Signed)
Third Trimester of Pregnancy The third trimester is from week 28 through week 40 (months 7 through 9). The third trimester is a time when the unborn baby (fetus) is growing rapidly. At the end of the ninth month, the fetus is about 20 inches in length and weighs 6-10 pounds. Body changes during your third trimester Your body will continue to go through many changes during pregnancy. The changes vary from woman to woman. During the third trimester:  Your weight will continue to increase. You can expect to gain 25-35 pounds (11-16 kg) by the end of the pregnancy.  You may begin to get stretch marks on your hips, abdomen, and breasts.  You may urinate more often because the fetus is moving lower into your pelvis and pressing on your bladder.  You may develop or continue to have heartburn. This is caused by increased hormones that slow down muscles in the digestive tract.  You may develop or continue to have constipation because increased hormones slow digestion and cause the muscles that push waste through your intestines to relax.  You may develop hemorrhoids. These are swollen veins (varicose veins) in the rectum that can itch or be painful.  You may develop swollen, bulging veins (varicose veins) in your legs.  You may have increased body aches in the pelvis, back, or thighs. This is due to weight gain and increased hormones that are relaxing your joints.  You may have changes in your hair. These can include thickening of your hair, rapid growth, and changes in texture. Some women also have hair loss during or after pregnancy, or hair that feels dry or thin. Your hair will most likely return to normal after your baby is born.  Your breasts will continue to grow and they will continue to become tender. A yellow fluid (colostrum) may leak from your breasts. This is the first milk you are producing for your baby.  Your belly button may stick out.  You may notice more swelling in your hands,  face, or ankles.  You may have increased tingling or numbness in your hands, arms, and legs. The skin on your belly may also feel numb.  You may feel short of breath because of your expanding uterus.  You may have more problems sleeping. This can be caused by the size of your belly, increased need to urinate, and an increase in your body's metabolism.  You may notice the fetus "dropping," or moving lower in your abdomen (lightening).  You may have increased vaginal discharge.  You may notice your joints feel loose and you may have pain around your pelvic bone.  What to expect at prenatal visits You will have prenatal exams every 2 weeks until week 36. Then you will have weekly prenatal exams. During a routine prenatal visit:  You will be weighed to make sure you and the baby are growing normally.  Your blood pressure will be taken.  Your abdomen will be measured to track your baby's growth.  The fetal heartbeat will be listened to.  Any test results from the previous visit will be discussed.  You may have a cervical check near your due date to see if your cervix has softened or thinned (effaced).  You will be tested for Group B streptococcus. This happens between 35 and 37 weeks.  Your health care provider may ask you:  What your birth plan is.  How you are feeling.  If you are feeling the baby move.  If you have had   any abnormal symptoms, such as leaking fluid, bleeding, severe headaches, or abdominal cramping.  If you are using any tobacco products, including cigarettes, chewing tobacco, and electronic cigarettes.  If you have any questions.  Other tests or screenings that may be performed during your third trimester include:  Blood tests that check for low iron levels (anemia).  Fetal testing to check the health, activity level, and growth of the fetus. Testing is done if you have certain medical conditions or if there are problems during the  pregnancy.  Nonstress test (NST). This test checks the health of your baby to make sure there are no signs of problems, such as the baby not getting enough oxygen. During this test, a belt is placed around your belly. The baby is made to move, and its heart rate is monitored during movement.  What is false labor? False labor is a condition in which you feel small, irregular tightenings of the muscles in the womb (contractions) that usually go away with rest, changing position, or drinking water. These are called Braxton Hicks contractions. Contractions may last for hours, days, or even weeks before true labor sets in. If contractions come at regular intervals, become more frequent, increase in intensity, or become painful, you should see your health care provider. What are the signs of labor?  Abdominal cramps.  Regular contractions that start at 10 minutes apart and become stronger and more frequent with time.  Contractions that start on the top of the uterus and spread down to the lower abdomen and back.  Increased pelvic pressure and dull back pain.  A watery or bloody mucus discharge that comes from the vagina.  Leaking of amniotic fluid. This is also known as your "water breaking." It could be a slow trickle or a gush. Let your health care provider know if it has a color or strange odor. If you have any of these signs, call your health care provider right away, even if it is before your due date. Follow these instructions at home: Medicines  Follow your health care provider's instructions regarding medicine use. Specific medicines may be either safe or unsafe to take during pregnancy.  Take a prenatal vitamin that contains at least 600 micrograms (mcg) of folic acid.  If you develop constipation, try taking a stool softener if your health care provider approves. Eating and drinking  Eat a balanced diet that includes fresh fruits and vegetables, whole grains, good sources of protein  such as meat, eggs, or tofu, and low-fat dairy. Your health care provider will help you determine the amount of weight gain that is right for you.  Avoid raw meat and uncooked cheese. These carry germs that can cause birth defects in the baby.  If you have low calcium intake from food, talk to your health care provider about whether you should take a daily calcium supplement.  Eat four or five small meals rather than three large meals a day.  Limit foods that are high in fat and processed sugars, such as fried and sweet foods.  To prevent constipation: ? Drink enough fluid to keep your urine clear or pale yellow. ? Eat foods that are high in fiber, such as fresh fruits and vegetables, whole grains, and beans. Activity  Exercise only as directed by your health care provider. Most women can continue their usual exercise routine during pregnancy. Try to exercise for 30 minutes at least 5 days a week. Stop exercising if you experience uterine contractions.  Avoid heavy   lifting.  Do not exercise in extreme heat or humidity, or at high altitudes.  Wear low-heel, comfortable shoes.  Practice good posture.  You may continue to have sex unless your health care provider tells you otherwise. Relieving pain and discomfort  Take frequent breaks and rest with your legs elevated if you have leg cramps or low back pain.  Take warm sitz baths to soothe any pain or discomfort caused by hemorrhoids. Use hemorrhoid cream if your health care provider approves.  Wear a good support bra to prevent discomfort from breast tenderness.  If you develop varicose veins: ? Wear support pantyhose or compression stockings as told by your healthcare provider. ? Elevate your feet for 15 minutes, 3-4 times a day. Prenatal care  Write down your questions. Take them to your prenatal visits.  Keep all your prenatal visits as told by your health care provider. This is important. Safety  Wear your seat belt at  all times when driving.  Make a list of emergency phone numbers, including numbers for family, friends, the hospital, and police and fire departments. General instructions  Avoid cat litter boxes and soil used by cats. These carry germs that can cause birth defects in the baby. If you have a cat, ask someone to clean the litter box for you.  Do not travel far distances unless it is absolutely necessary and only with the approval of your health care provider.  Do not use hot tubs, steam rooms, or saunas.  Do not drink alcohol.  Do not use any products that contain nicotine or tobacco, such as cigarettes and e-cigarettes. If you need help quitting, ask your health care provider.  Do not use any medicinal herbs or unprescribed drugs. These chemicals affect the formation and growth of the baby.  Do not douche or use tampons or scented sanitary pads.  Do not cross your legs for long periods of time.  To prepare for the arrival of your baby: ? Take prenatal classes to understand, practice, and ask questions about labor and delivery. ? Make a trial run to the hospital. ? Visit the hospital and tour the maternity area. ? Arrange for maternity or paternity leave through employers. ? Arrange for family and friends to take care of pets while you are in the hospital. ? Purchase a rear-facing car seat and make sure you know how to install it in your car. ? Pack your hospital bag. ? Prepare the baby's nursery. Make sure to remove all pillows and stuffed animals from the baby's crib to prevent suffocation.  Visit your dentist if you have not gone during your pregnancy. Use a soft toothbrush to brush your teeth and be gentle when you floss. Contact a health care provider if:  You are unsure if you are in labor or if your water has broken.  You become dizzy.  You have mild pelvic cramps, pelvic pressure, or nagging pain in your abdominal area.  You have lower back pain.  You have persistent  nausea, vomiting, or diarrhea.  You have an unusual or bad smelling vaginal discharge.  You have pain when you urinate. Get help right away if:  Your water breaks before 37 weeks.  You have regular contractions less than 5 minutes apart before 37 weeks.  You have a fever.  You are leaking fluid from your vagina.  You have spotting or bleeding from your vagina.  You have severe abdominal pain or cramping.  You have rapid weight loss or weight gain.    You have shortness of breath with chest pain.  You notice sudden or extreme swelling of your face, hands, ankles, feet, or legs.  Your baby makes fewer than 10 movements in 2 hours.  You have severe headaches that do not go away when you take medicine.  You have vision changes. Summary  The third trimester is from week 28 through week 40, months 7 through 9. The third trimester is a time when the unborn baby (fetus) is growing rapidly.  During the third trimester, your discomfort may increase as you and your baby continue to gain weight. You may have abdominal, leg, and back pain, sleeping problems, and an increased need to urinate.  During the third trimester your breasts will keep growing and they will continue to become tender. A yellow fluid (colostrum) may leak from your breasts. This is the first milk you are producing for your baby.  False labor is a condition in which you feel small, irregular tightenings of the muscles in the womb (contractions) that eventually go away. These are called Braxton Hicks contractions. Contractions may last for hours, days, or even weeks before true labor sets in.  Signs of labor can include: abdominal cramps; regular contractions that start at 10 minutes apart and become stronger and more frequent with time; watery or bloody mucus discharge that comes from the vagina; increased pelvic pressure and dull back pain; and leaking of amniotic fluid. This information is not intended to replace advice  given to you by your health care provider. Make sure you discuss any questions you have with your health care provider. Document Released: 02/21/2001 Document Revised: 08/05/2015 Document Reviewed: 04/30/2012 Elsevier Interactive Patient Education  2017 [employer]  

## 2016-11-15 NOTE — Progress Notes (Signed)
   PRENATAL VISIT NOTE  Subjective:  Rachel Gregory is a 26 y.o. G2P0010 at 227w3d being seen today for ongoing prenatal care.  She is currently monitored for the following issues for this low-risk pregnancy and has Endometriosis; Tobacco abuse; Supervision of low-risk pregnancy; Obesity in pregnancy; Obesity (BMI 35.0-39.9 without comorbidity); Nausea and vomiting of pregnancy, antepartum; and Excessive weight gain in pregnancy on her problem list.  Patient reports occasional contractions.  Contractions: Irritability. Vag. Bleeding: None.  Movement: Present. Denies leaking of fluid.   The following portions of the patient's history were reviewed and updated as appropriate: allergies, current medications, past family history, past medical history, past social history, past surgical history and problem list. Problem list updated.  Objective:   Vitals:   11/15/16 0841  BP: 108/67  Pulse: 69  Weight: 253 lb (114.8 kg)    Fetal Status: Fetal Heart Rate (bpm): 135   Movement: Present     General:  Alert, oriented and cooperative. Patient is in no acute distress.  Skin: Skin is warm and dry. No rash noted.   Cardiovascular: Normal heart rate noted  Respiratory: Normal respiratory effort, no problems with respiration noted  Abdomen: Soft, gravid, appropriate for gestational age.  Pain/Pressure: Present     Pelvic: Cervical exam deferred        Extremities: Normal range of motion.  Edema: Trace  Mental Status:  Normal mood and affect. Normal behavior. Normal judgment and thought content.   Assessment and Plan:  Pregnancy: G2P0010 at 7027w3d  1. Encounter for supervision of low-risk pregnancy, antepartum  - ranitidine (ZANTAC) 150 MG tablet; Take 150 mg by mouth at bedtime. - Prenatal Vit-Fe Fumarate-FA (MULTIVITAMIN-PRENATAL) 27-0.8 MG TABS tablet; Take 1 tablet by mouth daily at 12 noon.  Dispense: 30 each; Refill: 12  2. Nausea and vomiting during pregnancy --Pt pharmacy would not  fill her medications because many were OTC meds.  Pt switched pharmacies so Rx resent. - ondansetron (ZOFRAN-ODT) 4 MG disintegrating tablet; Take 1 tablet (4 mg total) by mouth every 8 (eight) hours as needed for nausea or vomiting.  Dispense: 3 tablet; Refill: 0 - doxylamine, Sleep, (UNISOM) 25 MG tablet; Take 1 tablet (25 mg) every night.  Take a second tablet or half tablet during the day as needed.  Dispense: 30 tablet; Refill: 2 - pyridoxine (B-6) 500 MG tablet; Take 1 tablet (500 mg total) by mouth 2 (two) times daily.  Dispense: 60 tablet; Refill: 5  3. Round ligament pain --Rest/ice/heat/warm bath/Tylenol/pregnancy support belt  Preterm labor symptoms and general obstetric precautions including but not limited to vaginal bleeding, contractions, leaking of fluid and fetal movement were reviewed in detail with the patient. Please refer to After Visit Summary for other counseling recommendations.  No Follow-up on file.   Sharen CounterLisa Leftwich-Kirby, CNM

## 2016-11-17 ENCOUNTER — Encounter: Payer: Self-pay | Admitting: Family Medicine

## 2016-11-17 ENCOUNTER — Ambulatory Visit: Payer: Medicaid Other | Admitting: Family Medicine

## 2016-11-21 ENCOUNTER — Encounter: Payer: Self-pay | Admitting: Family Medicine

## 2016-11-29 ENCOUNTER — Encounter: Payer: Medicaid Other | Admitting: Obstetrics and Gynecology

## 2016-12-03 ENCOUNTER — Other Ambulatory Visit: Payer: Self-pay | Admitting: Advanced Practice Midwife

## 2016-12-03 DIAGNOSIS — O219 Vomiting of pregnancy, unspecified: Secondary | ICD-10-CM

## 2016-12-04 MED ORDER — ONDANSETRON 4 MG PO TBDP: 4.0000 mg | ORAL_TABLET | Freq: Three times a day (TID) | ORAL | 0 refills | Status: DC | PRN

## 2016-12-13 ENCOUNTER — Encounter: Payer: Self-pay | Admitting: Advanced Practice Midwife

## 2016-12-13 ENCOUNTER — Ambulatory Visit (INDEPENDENT_AMBULATORY_CARE_PROVIDER_SITE_OTHER): Payer: Medicaid Other | Admitting: Advanced Practice Midwife

## 2016-12-13 VITALS — BP 114/62 | HR 68 | Wt 261.3 lb

## 2016-12-13 DIAGNOSIS — Z720 Tobacco use: Secondary | ICD-10-CM

## 2016-12-13 DIAGNOSIS — Z3493 Encounter for supervision of normal pregnancy, unspecified, third trimester: Secondary | ICD-10-CM

## 2016-12-15 NOTE — Patient Instructions (Signed)

## 2016-12-15 NOTE — Progress Notes (Signed)
   PRENATAL VISIT NOTE  Subjective:  Rachel Gregory is a 26 y.o. G2P0010 at [redacted]w[redacted]d being seen today for ongoing prenatal care.  She is currently monitored for the following issues for this low-risk pregnancy and has Endometriosis; Tobacco abuse; Supervision of low-risk pregnancy; Obesity in pregnancy; Obesity (BMI 35.0-39.9 without comorbidity); Nausea and vomiting of pregnancy, antepartum; and Excessive weight gain in pregnancy on her problem list.  Patient reports no complaints.  Contractions: Not present. Vag. Bleeding: None.  Movement: Present. Denies leaking of fluid.   The following portions of the patient's history were reviewed and updated as appropriate: allergies, current medications, past family history, past medical history, past social history, past surgical history and problem list. Problem list updated.  Objective:   Vitals:   12/13/16 0930  BP: 114/62  Pulse: 68  Weight: 261 lb 4.8 oz (118.5 kg)    Fetal Status: Fetal Heart Rate (bpm): 160 Fundal Height: 36 cm Movement: Present  Presentation: Vertex  General:  Alert, oriented and cooperative. Patient is in no acute distress.  Skin: Skin is warm and dry. No rash noted.   Cardiovascular: Normal heart rate noted  Respiratory: Normal respiratory effort, no problems with respiration noted  Abdomen: Soft, gravid, appropriate for gestational age.  Pain/Pressure: Present     Pelvic: Cervical exam deferred        Extremities: Normal range of motion.  Edema: Trace  Mental Status:  Normal mood and affect. Normal behavior. Normal judgment and thought content.   Assessment and Plan:  Pregnancy: G2P0010 at [redacted]w[redacted]d  1. Tobacco abuse   2. Encounter for supervision of low-risk pregnancy in third trimester   Preterm labor symptoms and general obstetric precautions including but not limited to vaginal bleeding, contractions, leaking of fluid and fetal movement were reviewed in detail with the patient. Please refer to After Visit  Summary for other counseling recommendations.  F/U 2 weeks   Dorathy Kinsman, [place]

## 2016-12-18 ENCOUNTER — Telehealth: Payer: Self-pay | Admitting: Obstetrics & Gynecology

## 2016-12-18 ENCOUNTER — Telehealth: Payer: Self-pay | Admitting: *Deleted

## 2016-12-18 DIAGNOSIS — O219 Vomiting of pregnancy, unspecified: Secondary | ICD-10-CM

## 2016-12-18 MED ORDER — RANITIDINE HCL 150 MG PO TABS: 150.0000 mg | ORAL_TABLET | Freq: Every day | ORAL | 0 refills | Status: DC

## 2016-12-18 NOTE — Telephone Encounter (Signed)
Nakkia called and reported she was told zantac would be at her pharmacy ; but it was'nt.per chart was listed as med she reported as taking; but not rx to pharmacy. She has medicaid and prefers rx. I informed her I can send to pharmacy. She voices understanding.

## 2016-12-18 NOTE — Telephone Encounter (Signed)
Patient spoke to pharmacist about her Rx not being sent. Spoke with Legrand Como, she will be calling her back.

## 2016-12-26 ENCOUNTER — Encounter: Payer: Self-pay | Admitting: Family Medicine

## 2016-12-26 ENCOUNTER — Ambulatory Visit: Payer: Medicaid Other | Admitting: Family Medicine

## 2016-12-27 ENCOUNTER — Ambulatory Visit (INDEPENDENT_AMBULATORY_CARE_PROVIDER_SITE_OTHER): Payer: Medicaid Other | Admitting: Medical

## 2016-12-27 ENCOUNTER — Other Ambulatory Visit (HOSPITAL_COMMUNITY)
Admission: RE | Admit: 2016-12-27 | Discharge: 2016-12-27 | Disposition: A | Discharge status: Home / Self Care | Payer: Medicaid Other | Source: Ambulatory Visit | Attending: Medical | Admitting: Medical

## 2016-12-27 VITALS — BP 123/60 | HR 76 | Wt 264.0 lb

## 2016-12-27 DIAGNOSIS — O2603 Excessive weight gain in pregnancy, third trimester: Secondary | ICD-10-CM | POA: Diagnosis not present

## 2016-12-27 DIAGNOSIS — O99213 Obesity complicating pregnancy, third trimester: Secondary | ICD-10-CM | POA: Diagnosis not present

## 2016-12-27 DIAGNOSIS — Z3A36 36 weeks gestation of pregnancy: Secondary | ICD-10-CM | POA: Insufficient documentation

## 2016-12-27 DIAGNOSIS — E669 Obesity, unspecified: Secondary | ICD-10-CM | POA: Insufficient documentation

## 2016-12-27 DIAGNOSIS — Z113 Encounter for screening for infections with a predominantly sexual mode of transmission: Secondary | ICD-10-CM

## 2016-12-27 DIAGNOSIS — Z6841 Body Mass Index (BMI) 40.0 and over, adult: Secondary | ICD-10-CM | POA: Insufficient documentation

## 2016-12-27 DIAGNOSIS — Z3483 Encounter for supervision of other normal pregnancy, third trimester: Secondary | ICD-10-CM | POA: Diagnosis not present

## 2016-12-27 DIAGNOSIS — Z3493 Encounter for supervision of normal pregnancy, unspecified, third trimester: Secondary | ICD-10-CM

## 2016-12-27 DIAGNOSIS — O99210 Obesity complicating pregnancy, unspecified trimester: Secondary | ICD-10-CM

## 2016-12-27 LAB — OB RESULTS CONSOLE GBS: STREP GROUP B AG: POSITIVE

## 2016-12-27 LAB — OB RESULTS CONSOLE GC/CHLAMYDIA: GC PROBE AMP, GENITAL: NEGATIVE

## 2016-12-27 NOTE — Patient Instructions (Addendum)
Fetal Movement Counts Patient Name: ________________________________________________ Patient Due Date: ____________________ What is a fetal movement count? A fetal movement count is the number of times that you feel your baby move during a certain amount of time. This may also be called a fetal kick count. A fetal movement count is recommended for every pregnant woman. You may be asked to start counting fetal movements as early as week 28 of your pregnancy. Pay attention to when your baby is most active. You may notice your baby's sleep and wake cycles. You may also notice things that make your baby move more. You should do a fetal movement count:  When your baby is normally most active.  At the same time each day.  A good time to count movements is while you are resting, after having something to eat and drink. How do I count fetal movements? 1. Find a quiet, comfortable area. Sit, or lie down on your side. 2. Write down the date, the start time and stop time, and the number of movements that you felt between those two times. Take this information with you to your health care visits. 3. For 2 hours, count kicks, flutters, swishes, rolls, and jabs. You should feel at least 10 movements during 2 hours. 4. You may stop counting after you have felt 10 movements. 5. If you do not feel 10 movements in 2 hours, have something to eat and drink. Then, keep resting and counting for 1 hour. If you feel at least 4 movements during that hour, you may stop counting. Contact a health care provider if:  You feel fewer than 4 movements in 2 hours.  Your baby is not moving like he or she usually does. Date: ____________ Start time: ____________ Stop time: ____________ Movements: ____________ Date: ____________ Start time: ____________ Stop time: ____________ Movements: ____________ Date: ____________ Start time: ____________ Stop time: ____________ Movements: ____________ Date: ____________ Start time:  ____________ Stop time: ____________ Movements: ____________ Date: ____________ Start time: ____________ Stop time: ____________ Movements: ____________ Date: ____________ Start time: ____________ Stop time: ____________ Movements: ____________ Date: ____________ Start time: ____________ Stop time: ____________ Movements: ____________ Date: ____________ Start time: ____________ Stop time: ____________ Movements: ____________ Date: ____________ Start time: ____________ Stop time: ____________ Movements: ____________ This information is not intended to replace advice given to you by your health care provider. Make sure you discuss any questions you have with your health care provider. Document Released: 03/29/2006 Document Revised: 10/27/2015 Document Reviewed: 04/08/2015 Elsevier Interactive Patient Education  2018 [employer].  [employer] of the uterus can occur throughout pregnancy, but they are not always a sign that you are in labor. You may have practice contractions called Braxton Hicks contractions. These false labor contractions are sometimes confused with true labor. What are [place] contractions? Braxton Hicks contractions are tightening movements that occur in the muscles of the uterus before labor. Unlike true labor contractions, these contractions do not result in opening (dilation) and thinning of the cervix. Toward the end of pregnancy (32-34 weeks), Braxton Hicks contractions can happen more often and may become stronger. These contractions are sometimes difficult to tell apart from true labor because they can be very uncomfortable. You should not feel embarrassed if you go to the hospital with false labor. Sometimes, the only way to tell if you are in true labor is for your health care provider to look for changes in the cervix. The health care provider will do a physical exam and may monitor your contractions.  If you are not in true labor, the exam  should show that your cervix is not dilating and your water has not broken. If there are no prenatal problems or other health problems associated with your pregnancy, it is completely safe for you to be sent home with false labor. You may continue to have Braxton Hicks contractions until you go into true labor. How can I tell the difference between true labor and false labor?  Differences ? False labor ? Contractions last 30-70 seconds.: Contractions are usually shorter and not as strong as true labor contractions. ? Contractions become very regular.: Contractions are usually irregular. ? Discomfort is usually felt in the top of the uterus, and it spreads to the lower abdomen and low back.: Contractions are often felt in the front of the lower abdomen and in the groin. ? Contractions do not go away with walking.: Contractions may go away when you walk around or change positions while lying down. ? Contractions usually become more intense and increase in frequency.: Contractions get weaker and are shorter-lasting as time goes on. ? The cervix dilates and gets thinner.: The cervix usually does not dilate or become thin. Follow these instructions at home:  Take over-the-counter and prescription medicines only as told by your health care provider.  Keep up with your usual exercises and follow other instructions from your health care provider.  Eat and drink lightly if you think you are going into labor.  If Braxton Hicks contractions are making you uncomfortable: ? Change your position from lying down or resting to walking, or change from walking to resting. ? Sit and rest in a tub of warm water. ? Drink enough fluid to keep your urine clear or pale yellow. Dehydration may cause these contractions. ? Do slow and deep breathing several times an hour.  Keep all follow-up prenatal visits as told by your health care provider. This is important. Contact a health care provider if:  You have a  fever.  You have continuous pain in your abdomen. Get help right away if:  Your contractions become stronger, more regular, and closer together.  You have fluid leaking or gushing from your vagina.  You pass blood-tinged mucus (bloody show).  You have bleeding from your vagina.  You have low back pain that you never had before.  You feel your baby's head pushing down and causing pelvic pressure.  Your baby is not moving inside you as much as it used to. Summary  Contractions that occur before labor are called Braxton Hicks contractions, false labor, or practice contractions.  Braxton Hicks contractions are usually shorter, weaker, farther apart, and less regular than true labor contractions. True labor contractions usually become progressively stronger and regular and they become more frequent.  Man[facility] from [facility] by changing position, resting in a warm bath, drinking plenty of water, or practicing deep breathing. This information is not intended to replace advice given to you by your health care provider. Make sure you discuss any questions you have with your health care provider. Document Released: 02/27/2005 Document Revised: 01/17/2016 Document Reviewed: 01/17/2016 Elsevier Interactive Patient Education  2017 [employer]  Places to have your son circumcised:    [facility] [phone] while you are in hospital  [facility] [phone] $244 by 4 wks  [facility] [phone] $175 by 2 wks  [facility] [phone] $250 by 7 days [facility] [phone] $150 by 4 wks  These prices sometimes change but are roughly what you can  to pay. Please call and confirm pricing.   Circumcision is considered an elective/non-medically necessary procedure. There are many reasons parents  decide to have their sons circumsized. During the first year of life circumcised males have a reduced risk of urinary tract infections but after this year the rates between circumcised males and uncircumcised males are the same.  It is safe to have your son circumcised outside of the hospital and the places above perform them regularly.   

## 2016-12-27 NOTE — Progress Notes (Signed)
   PRENATAL VISIT NOTE  Subjective:  Rachel Gregory is a 26 y.o. G2P0010 at 6810w3d being seen today for ongoing prenatal care.  She is currently monitored for the following issues for this low-risk pregnancy and has Endometriosis; Tobacco abuse; Supervision of low-risk pregnancy; Obesity in pregnancy; Obesity (BMI 35.0-39.9 without comorbidity); Nausea and vomiting of pregnancy, antepartum; and Excessive weight gain in pregnancy on her problem list.  Patient reports no complaints.  Contractions: Not present. Vag. Bleeding: None.  Movement: Present. Denies leaking of fluid.   The following portions of the patient's history were reviewed and updated as appropriate: allergies, current medications, past family history, past medical history, past social history, past surgical history and problem list. Problem list updated.  Objective:   Vitals:   12/27/16 0906  BP: 123/60  Pulse: 76  Weight: 264 lb (119.7 kg)    Fetal Status: Fetal Heart Rate (bpm): 140 Fundal Height: 37 cm Movement: Present  Presentation: Vertex  General:  Alert, oriented and cooperative. Patient is in no acute distress.  Skin: Skin is warm and dry. No rash noted.   Cardiovascular: Normal heart rate noted  Respiratory: Normal respiratory effort, no problems with respiration noted  Abdomen: Soft, gravid, appropriate for gestational age.  Pain/Pressure: Present     Pelvic: Cervical exam performed Dilation: Fingertip Effacement (%): 50 Station: -2  Extremities: Normal range of motion.  Edema: Trace  Mental Status:  Normal mood and affect. Normal behavior. Normal judgment and thought content.   Assessment and Plan:  Pregnancy: G2P0010 at 6010w3d  1. Encounter for supervision of low-risk pregnancy in third trimester - [facility]lam[facility] - Culture, beta strep (group b only)  2. Obesity in pregnancy  3. Excessive weight gain during pregnancy in third trimester - Discussed healthy food choices  and staying active  Preterm labor symptoms and general obstetric precautions including but not limited to vaginal bleeding, contractions, leaking of fluid and fetal movement were reviewed in detail with the patient. Please refer to After Visit Summary for other counseling recommendations.  Return in about 1 week (around 01/03/2017) for LOB.   Vonzella NippleJulie Wenzel, PA-C

## 2016-12-28 LAB — GC/CHLAMYDIA PROBE AMP (~~LOC~~) NOT AT ARMC
Chlamydia: NEGATIVE
Neisseria Gonorrhea: NEGATIVE

## 2016-12-30 LAB — CULTURE, BETA STREP (GROUP B ONLY): Strep Gp B Culture: POSITIVE — AB

## 2017-01-03 ENCOUNTER — Ambulatory Visit (INDEPENDENT_AMBULATORY_CARE_PROVIDER_SITE_OTHER): Payer: Medicaid Other | Admitting: Advanced Practice Midwife

## 2017-01-03 DIAGNOSIS — Z3483 Encounter for supervision of other normal pregnancy, third trimester: Secondary | ICD-10-CM

## 2017-01-03 DIAGNOSIS — Z3490 Encounter for supervision of normal pregnancy, unspecified, unspecified trimester: Secondary | ICD-10-CM

## 2017-01-03 NOTE — Progress Notes (Signed)
   PRENATAL VISIT NOTE  Subjective:  Rachel Gregory is a 26 y.o. G2P0010 at 7230w3d being seen today for ongoing prenatal care.  She is currently monitored for the following issues for this low-risk pregnancy and has Endometriosis; Tobacco abuse; Supervision of low-risk pregnancy; Obesity in pregnancy; Obesity (BMI 35.0-39.9 without comorbidity); Nausea and vomiting of pregnancy, antepartum; and Excessive weight gain in pregnancy on her problem list.  Patient reports occasional contractions.  Contractions: Not present. Vag. Bleeding: None.  Movement: Present. Denies leaking of fluid.   The following portions of the patient's history were reviewed and updated as appropriate: allergies, current medications, past family history, past medical history, past social history, past surgical history and problem list. Problem list updated.  Objective:   Vitals:   01/03/17 0943  BP: 114/65  Pulse: (!) 53  Weight: 265 lb 1.6 oz (120.2 kg)    Fetal Status: Fetal Heart Rate (bpm): 142 Fundal Height: 40 cm Movement: Present  Presentation: Vertex  General:  Alert, oriented and cooperative. Patient is in no acute distress.  Skin: Skin is warm and dry. No rash noted.   Cardiovascular: Normal heart rate noted  Respiratory: Normal respiratory effort, no problems with respiration noted  Abdomen: Soft, gravid, appropriate for gestational age.  Pain/Pressure: Present     Pelvic: Cervical exam deferred        Extremities: Normal range of motion.  Edema: None  Mental Status:  Normal mood and affect. Normal behavior. Normal judgment and thought content.   Assessment and Plan:  Pregnancy: G2P0010 at 1630w3d  1. Encounter for supervision of low-risk pregnancy, antepartum   Term labor symptoms and general obstetric precautions including but not limited to vaginal bleeding, contractions, leaking of fluid and fetal movement were reviewed in detail with the patient. Please refer to After Visit Summary for other  counseling recommendations.  F/U 1 week   Rachel Gregory, [place]

## 2017-01-03 NOTE — Patient Instructions (Signed)
Group B Streptococcus Infection During Pregnancy Group B Streptococcus (GBS) is a type of bacteria (Streptococcus agalactiae) that is often found in healthy people, commonly in the rectum, vagina, and intestines. In people who are healthy and not pregnant, the bacteria rarely cause serious illness or complications. However, women who test positive for GBS during pregnancy can pass the bacteria to their baby during childbirth, which can cause serious infection in the baby after birth. Women with GBS may also have infections during their pregnancy or immediately after childbirth, such as such as urinary tract infections (UTIs) or infections of the uterus (uterine infections). Having GBS also increases a woman's risk of complications during pregnancy, such as early (preterm) labor or delivery, miscarriage, or stillbirth. Routine testing (screening) for GBS is recommended for all pregnant women. What increases the risk? You may have a higher risk for GBS infection during pregnancy if you had one during a past pregnancy. What are the signs or symptoms? In most cases, GBS infection does not cause symptoms in pregnant women. Signs and symptoms of a possible GBS-related infection may include:  Labor starting before the 37th week of pregnancy.  A UTI or bladder infection, which may cause: ? Fever. ? Pain or burning during urination. ? Frequent urination.  Fever during labor, along with: ? Bad-smelling discharge. ? Uterine tenderness. ? Rapid heartbeat in the mother, baby, or both.  Rare but serious symptoms of a possible GBS-related infection in women include:  Blood infection (septicemia). This may cause fever, chills, or confusion.  Lung infection (pneumonia). This may cause fever, chills, cough, rapid breathing, difficulty breathing, or chest pain.  Bone, joint, skin, or soft tissue infection.  How is this diagnosed? You may be screened for GBS between week 35 and week 37 of your pregnancy. If  you have symptoms of preterm labor, you may be screened earlier. This condition is diagnosed based on lab test results from:  A swab of fluid from the vagina and rectum.  A urine sample.  How is this treated? This condition is treated with antibiotic medicine. When you go into labor, or as soon as your water breaks (your membranes rupture), you will be given antibiotics through an IV tube. Antibiotics will continue until after you give birth. If you are having a cesarean delivery, you do not need antibiotics unless your membranes have already ruptured. Follow these instructions at home:  Take over-the-counter and prescription medicines only as told by your health care provider.  Take your antibiotic medicine as told by your health care provider. Do not stop taking the antibiotic even if you start to feel better.  Keep all pre-birth (prenatal) visits and follow-up visits as told by your health care provider. This is important. Contact a health care provider if:  You have pain or burning when you urinate.  You have to urinate frequently.  You have a fever or chills.  You develop a bad-smelling vaginal discharge. Get help right away if:  Your membranes rupture.  You go into labor.  You have severe pain in your abdomen.  You have difficulty breathing.  You have chest pain. This information is not intended to replace advice given to you by your health care provider. Make sure you discuss any questions you have with your health care provider. Document Released: 06/06/2007 Document Revised: 09/24/2015 Document Reviewed: 09/23/2015 Elsevier Interactive Patient Education  2018 Elsevier Inc.  

## 2017-01-10 ENCOUNTER — Ambulatory Visit (INDEPENDENT_AMBULATORY_CARE_PROVIDER_SITE_OTHER): Payer: Medicaid Other | Admitting: Medical

## 2017-01-10 VITALS — BP 118/54 | HR 72 | Wt 265.5 lb

## 2017-01-10 DIAGNOSIS — O99210 Obesity complicating pregnancy, unspecified trimester: Secondary | ICD-10-CM

## 2017-01-10 DIAGNOSIS — Z3493 Encounter for supervision of normal pregnancy, unspecified, third trimester: Secondary | ICD-10-CM

## 2017-01-10 NOTE — Patient Instructions (Signed)
Fetal Movement Counts °Patient Name: ________________________________________________ Patient Due Date: ____________________ °What is a fetal movement count? °A fetal movement count is the number of times that you feel your baby move during a certain amount of time. This may also be called a fetal kick count. A fetal movement count is recommended for every pregnant woman. You may be asked to start counting fetal movements as early as week 28 of your pregnancy. °Pay attention to when your baby is most active. You may notice your baby's sleep and wake cycles. You may also notice things that make your baby move more. You should do a fetal movement count: °· When your baby is normally most active. °· At the same time each day. ° °A good time to count movements is while you are resting, after having something to eat and drink. °How do I count fetal movements? °1. Find a quiet, comfortable area. Sit, or lie down on your side. °2. Write down the date, the start time and stop time, and the number of movements that you felt between those two times. Take this information with you to your health care visits. °3. For 2 hours, count kicks, flutters, swishes, rolls, and jabs. You should feel at least 10 movements during 2 hours. °4. You may stop counting after you have felt 10 movements. °5. If you do not feel 10 movements in 2 hours, have something to eat and drink. Then, keep resting and counting for 1 hour. If you feel at least 4 movements during that hour, you may stop counting. °Contact a health care provider if: °· You feel fewer than 4 movements in 2 hours. °· Your baby is not moving like he or she usually does. °Date: ____________ Start time: ____________ Stop time: ____________ Movements: ____________ °Date: ____________ Start time: ____________ Stop time: ____________ Movements: ____________ °Date: ____________ Start time: ____________ Stop time: ____________ Movements: ____________ °Date: ____________ Start time:  ____________ Stop time: ____________ Movements: ____________ °Date: ____________ Start time: ____________ Stop time: ____________ Movements: ____________ °Date: ____________ Start time: ____________ Stop time: ____________ Movements: ____________ °Date: ____________ Start time: ____________ Stop time: ____________ Movements: ____________ °Date: ____________ Start time: ____________ Stop time: ____________ Movements: ____________ °Date: ____________ Start time: ____________ Stop time: ____________ Movements: ____________ °This information is not intended to replace advice given to you by your health care provider. Make sure you discuss any questions you have with your health care provider. °Document Released: 03/29/2006 Document Revised: 10/27/2015 Document Reviewed: 04/08/2015 °Elsevier Interactive Patient Education © 2018 Elsevier Inc. °Braxton Hicks Contractions °Contractions of the uterus can occur throughout pregnancy, but they are not always a sign that you are in labor. You may have practice contractions called Braxton Hicks contractions. These false labor contractions are sometimes confused with true labor. °What are Braxton Hicks contractions? °Braxton Hicks contractions are tightening movements that occur in the muscles of the uterus before labor. Unlike true labor contractions, these contractions do not result in opening (dilation) and thinning of the cervix. Toward the end of pregnancy (32-34 weeks), Braxton Hicks contractions can happen more often and may become stronger. These contractions are sometimes difficult to tell apart from true labor because they can be very uncomfortable. You should not feel embarrassed if you go to the hospital with false labor. °Sometimes, the only way to tell if you are in true labor is for your health care provider to look for changes in the cervix. The health care provider will do a physical exam and may monitor your contractions. If   you are not in true labor, the exam  should show that your cervix is not dilating and your water has not broken. °If there are no prenatal problems or other health problems associated with your pregnancy, it is completely safe for you to be sent home with false labor. You may continue to have Braxton Hicks contractions until you go into true labor. °How can I tell the difference between true labor and false labor? °· Differences °? False labor °? Contractions last 30-70 seconds.: Contractions are usually shorter and not as strong as true labor contractions. °? Contractions become very regular.: Contractions are usually irregular. °? Discomfort is usually felt in the top of the uterus, and it spreads to the lower abdomen and low back.: Contractions are often felt in the front of the lower abdomen and in the groin. °? Contractions do not go away with walking.: Contractions may go away when you walk around or change positions while lying down. °? Contractions usually become more intense and increase in frequency.: Contractions get weaker and are shorter-lasting as time goes on. °? The cervix dilates and gets thinner.: The cervix usually does not dilate or become thin. °Follow these instructions at home: °· Take over-the-counter and prescription medicines only as told by your health care provider. °· Keep up with your usual exercises and follow other instructions from your health care provider. °· Eat and drink lightly if you think you are going into labor. °· If Braxton Hicks contractions are making you uncomfortable: °? Change your position from lying down or resting to walking, or change from walking to resting. °? Sit and rest in a tub of warm water. °? Drink enough fluid to keep your urine clear or pale yellow. Dehydration may cause these contractions. °? Do slow and deep breathing several times an hour. °· Keep all follow-up prenatal visits as told by your health care provider. This is important. °Contact a health care provider if: °· You have a  fever. °· You have continuous pain in your abdomen. °Get help right away if: °· Your contractions become stronger, more regular, and closer together. °· You have fluid leaking or gushing from your vagina. °· You pass blood-tinged mucus (bloody show). °· You have bleeding from your vagina. °· You have low back pain that you never had before. °· You feel your baby’s head pushing down and causing pelvic pressure. °· Your baby is not moving inside you as much as it used to. °Summary °· Contractions that occur before labor are called Braxton Hicks contractions, false labor, or practice contractions. °· Braxton Hicks contractions are usually shorter, weaker, farther apart, and less regular than true labor contractions. True labor contractions usually become progressively stronger and regular and they become more frequent. °· Manage discomfort from Braxton Hicks contractions by changing position, resting in a warm bath, drinking plenty of water, or practicing deep breathing. °This information is not intended to replace advice given to you by your health care provider. Make sure you discuss any questions you have with your health care provider. °Document Released: 02/27/2005 Document Revised: 01/17/2016 Document Reviewed: 01/17/2016 °Elsevier Interactive Patient Education © 2017 Elsevier Inc. ° °

## 2017-01-10 NOTE — Progress Notes (Signed)
   PRENATAL VISIT NOTE  Subjective:  Rachel Gregory is a 26 y.o. G2P0010 at 2364w3d being seen today for ongoing prenatal care.  She is currently monitored for the following issues for this low-risk pregnancy and has Endometriosis; Tobacco abuse; Supervision of low-risk pregnancy; Obesity in pregnancy; Obesity (BMI 35.0-39.9 without comorbidity); Nausea and vomiting of pregnancy, antepartum; and Excessive weight gain in pregnancy on her problem list.  Patient reports backache.  Contractions: Irregular. Vag. Bleeding: None.  Movement: Present. Denies leaking of fluid.   The following portions of the patient's history were reviewed and updated as appropriate: allergies, current medications, past family history, past medical history, past social history, past surgical history and problem list. Problem list updated.  Objective:   Vitals:   01/10/17 1002  BP: (!) 118/54  Pulse: 72  Weight: 265 lb 8 oz (120.4 kg)    Fetal Status: Fetal Heart Rate (bpm): 144 Fundal Height: 40 cm Movement: Present     General:  Alert, oriented and cooperative. Patient is in no acute distress.  Skin: Skin is warm and dry. No rash noted.   Cardiovascular: Normal heart rate noted  Respiratory: Normal respiratory effort, no problems with respiration noted  Abdomen: Soft, gravid, appropriate for gestational age.  Pain/Pressure: Present     Pelvic: Cervical exam deferred        Extremities: Normal range of motion.  Edema: Trace  Mental Status:  Normal mood and affect. Normal behavior. Normal judgment and thought content.   Assessment and Plan:  Pregnancy: G2P0010 at 10664w3d  1. Obesity in pregnancy - Maintained weight from last visit  2. Encounter for supervision of low-risk pregnancy in third trimester - Doing well - Discussed scheduling IOL at next visit for 41 weeks if not delivered  Term labor symptoms and general obstetric precautions including but not limited to vaginal bleeding, contractions, leaking  of fluid and fetal movement were reviewed in detail with the patient. Please refer to After Visit Summary for other counseling recommendations.  Return in about 1 week (around 01/17/2017) for LOB.   Vonzella NippleJulie Kendyl Bissonnette, PA-C

## 2017-01-16 ENCOUNTER — Encounter (HOSPITAL_COMMUNITY): Payer: Self-pay | Admitting: Anesthesiology

## 2017-01-16 ENCOUNTER — Other Ambulatory Visit: Payer: Self-pay

## 2017-01-16 ENCOUNTER — Inpatient Hospital Stay (HOSPITAL_COMMUNITY): Payer: Medicaid Other | Admitting: Anesthesiology

## 2017-01-16 ENCOUNTER — Encounter (HOSPITAL_COMMUNITY)
Admission: AD | Disposition: A | Discharge status: Home / Self Care | Payer: Self-pay | Source: Ambulatory Visit | Attending: Family Medicine

## 2017-01-16 ENCOUNTER — Inpatient Hospital Stay (HOSPITAL_COMMUNITY)
Admission: AD | Admit: 2017-01-16 | Discharge: 2017-01-19 | DRG: 788 | Disposition: A | Discharge status: Home / Self Care | Payer: Medicaid Other | Source: Ambulatory Visit | Attending: Family Medicine | Admitting: Family Medicine

## 2017-01-16 ENCOUNTER — Encounter (HOSPITAL_COMMUNITY): Payer: Self-pay | Admitting: *Deleted

## 2017-01-16 DIAGNOSIS — O2603 Excessive weight gain in pregnancy, third trimester: Secondary | ICD-10-CM | POA: Diagnosis present

## 2017-01-16 DIAGNOSIS — O34219 Maternal care for unspecified type scar from previous cesarean delivery: Secondary | ICD-10-CM

## 2017-01-16 DIAGNOSIS — K219 Gastro-esophageal reflux disease without esophagitis: Secondary | ICD-10-CM | POA: Diagnosis present

## 2017-01-16 DIAGNOSIS — O9962 Diseases of the digestive system complicating childbirth: Secondary | ICD-10-CM | POA: Diagnosis present

## 2017-01-16 DIAGNOSIS — O99824 Streptococcus B carrier state complicating childbirth: Secondary | ICD-10-CM | POA: Diagnosis present

## 2017-01-16 DIAGNOSIS — O99334 Smoking (tobacco) complicating childbirth: Secondary | ICD-10-CM | POA: Diagnosis present

## 2017-01-16 DIAGNOSIS — E669 Obesity, unspecified: Secondary | ICD-10-CM | POA: Diagnosis present

## 2017-01-16 DIAGNOSIS — F17210 Nicotine dependence, cigarettes, uncomplicated: Secondary | ICD-10-CM | POA: Diagnosis present

## 2017-01-16 DIAGNOSIS — O99214 Obesity complicating childbirth: Secondary | ICD-10-CM | POA: Diagnosis present

## 2017-01-16 DIAGNOSIS — Z3A39 39 weeks gestation of pregnancy: Secondary | ICD-10-CM

## 2017-01-16 DIAGNOSIS — Z98891 History of uterine scar from previous surgery: Secondary | ICD-10-CM

## 2017-01-16 LAB — CBC
HEMATOCRIT: 36.2 % (ref 36.0–46.0)
Hemoglobin: 12.2 g/dL (ref 12.0–15.0)
MCH: 28.4 pg (ref 26.0–34.0)
MCHC: 33.7 g/dL (ref 30.0–36.0)
MCV: 84.4 fL (ref 78.0–100.0)
Platelets: 173 10*3/uL (ref 150–400)
RBC: 4.29 MIL/uL (ref 3.87–5.11)
RDW: 15.1 % (ref 11.5–15.5)
WBC: 9.9 10*3/uL (ref 4.0–10.5)

## 2017-01-16 LAB — TYPE AND SCREEN
ABO/RH(D): A POS
Antibody Screen: NEGATIVE

## 2017-01-16 LAB — RPR: RPR Ser Ql: NONREACTIVE

## 2017-01-16 LAB — POCT FERN TEST: POCT FERN TEST: POSITIVE

## 2017-01-16 LAB — ABO/RH: ABO/RH(D): A POS

## 2017-01-16 SURGERY — Surgical Case
Anesthesia: Epidural

## 2017-01-16 MED ORDER — PENICILLIN G POT IN DEXTROSE 60000 UNIT/ML IV SOLN
3.0000 10*6.[IU] | INTRAVENOUS | Status: DC
  Administered 2017-01-16 (×2): 3 10*6.[IU] via INTRAVENOUS
  Filled 2017-01-16 (×4): qty 50

## 2017-01-16 MED ORDER — FENTANYL 2.5 MCG/ML BUPIVACAINE 1/10 % EPIDURAL INFUSION (WH - ANES): 14.0000 mL/h | INTRAMUSCULAR | Status: DC | PRN

## 2017-01-16 MED ORDER — PHENYLEPHRINE 40 MCG/ML (10ML) SYRINGE FOR IV PUSH (FOR BLOOD PRESSURE SUPPORT)
PREFILLED_SYRINGE | INTRAVENOUS | Status: AC
  Filled 2017-01-16: qty 10

## 2017-01-16 MED ORDER — LIDOCAINE HCL (PF) 1 % IJ SOLN
INTRAMUSCULAR | Status: DC | PRN
  Administered 2017-01-16 (×2): 4 mL via EPIDURAL

## 2017-01-16 MED ORDER — LACTATED RINGERS IV SOLN: 500.0000 mL | Freq: Once | INTRAVENOUS | Status: DC

## 2017-01-16 MED ORDER — AZITHROMYCIN 500 MG IV SOLR
500.0000 mg | Freq: Once | INTRAVENOUS | Status: AC
  Administered 2017-01-16: 500 mg via INTRAVENOUS
  Filled 2017-01-16: qty 500

## 2017-01-16 MED ORDER — SCOPOLAMINE 1 MG/3DAYS TD PT72
MEDICATED_PATCH | TRANSDERMAL | Status: DC | PRN
  Administered 2017-01-16: 1 via TRANSDERMAL

## 2017-01-16 MED ORDER — PHENYLEPHRINE 40 MCG/ML (10ML) SYRINGE FOR IV PUSH (FOR BLOOD PRESSURE SUPPORT): 80.0000 ug | PREFILLED_SYRINGE | INTRAVENOUS | Status: DC | PRN

## 2017-01-16 MED ORDER — EPHEDRINE 5 MG/ML INJ: 10.0000 mg | INTRAVENOUS | Status: DC | PRN

## 2017-01-16 MED ORDER — OXYTOCIN 40 UNITS IN LACTATED RINGERS INFUSION - SIMPLE MED
1.0000 m[IU]/min | INTRAVENOUS | Status: DC
  Administered 2017-01-16 (×2): 2 m[IU]/min via INTRAVENOUS
  Administered 2017-01-16 (×2): 1 m[IU]/min via INTRAVENOUS
  Filled 2017-01-16: qty 1000

## 2017-01-16 MED ORDER — LACTATED RINGERS IV SOLN
INTRAVENOUS | Status: DC | PRN
  Administered 2017-01-16: 40 [IU] via INTRAVENOUS

## 2017-01-16 MED ORDER — LACTATED RINGERS IV SOLN
INTRAVENOUS | Status: DC
  Administered 2017-01-16 (×2): via INTRAVENOUS

## 2017-01-16 MED ORDER — ONDANSETRON HCL 4 MG/2ML IJ SOLN
INTRAMUSCULAR | Status: DC | PRN
  Administered 2017-01-16: 4 mg via INTRAVENOUS

## 2017-01-16 MED ORDER — TERBUTALINE SULFATE 1 MG/ML IJ SOLN
0.2500 mg | Freq: Once | INTRAMUSCULAR | Status: AC | PRN
  Administered 2017-01-16: 0.25 mg via SUBCUTANEOUS
  Filled 2017-01-16: qty 1

## 2017-01-16 MED ORDER — DEXAMETHASONE SODIUM PHOSPHATE 4 MG/ML IJ SOLN
INTRAMUSCULAR | Status: DC | PRN
  Administered 2017-01-16: 4 mg via INTRAVENOUS

## 2017-01-16 MED ORDER — MORPHINE SULFATE (PF) 0.5 MG/ML IJ SOLN
INTRAMUSCULAR | Status: DC | PRN
  Administered 2017-01-16: 1 mg via INTRAVENOUS
  Administered 2017-01-16: 4 mg via EPIDURAL

## 2017-01-16 MED ORDER — OXYTOCIN 10 UNIT/ML IJ SOLN
INTRAMUSCULAR | Status: AC
  Filled 2017-01-16: qty 4

## 2017-01-16 MED ORDER — LACTATED RINGERS IV SOLN
500.0000 mL | Freq: Once | INTRAVENOUS | Status: AC
  Administered 2017-01-16: 500 mL via INTRAVENOUS

## 2017-01-16 MED ORDER — OXYCODONE-ACETAMINOPHEN 5-325 MG PO TABS
1.0000 | ORAL_TABLET | ORAL | Status: DC | PRN
Start: 2017-01-16 — End: 2017-01-17

## 2017-01-16 MED ORDER — DIPHENHYDRAMINE HCL 50 MG/ML IJ SOLN: 12.5000 mg | INTRAMUSCULAR | Status: DC | PRN

## 2017-01-16 MED ORDER — SODIUM BICARBONATE 8.4 % IV SOLN
INTRAVENOUS | Status: DC | PRN
  Administered 2017-01-16 (×3): 5 mL via EPIDURAL

## 2017-01-16 MED ORDER — SCOPOLAMINE 1 MG/3DAYS TD PT72
MEDICATED_PATCH | TRANSDERMAL | Status: AC
  Filled 2017-01-16: qty 1

## 2017-01-16 MED ORDER — FENTANYL CITRATE (PF) 100 MCG/2ML IJ SOLN
INTRAMUSCULAR | Status: AC
  Filled 2017-01-16: qty 2

## 2017-01-16 MED ORDER — OXYTOCIN 40 UNITS IN LACTATED RINGERS INFUSION - SIMPLE MED: 2.5000 [IU]/h | INTRAVENOUS | Status: DC

## 2017-01-16 MED ORDER — LIDOCAINE-EPINEPHRINE (PF) 2 %-1:200000 IJ SOLN
INTRAMUSCULAR | Status: AC
  Filled 2017-01-16: qty 40

## 2017-01-16 MED ORDER — MEPERIDINE HCL 25 MG/ML IJ SOLN
INTRAMUSCULAR | Status: AC
  Filled 2017-01-16: qty 1

## 2017-01-16 MED ORDER — ACETAMINOPHEN 325 MG PO TABS: 650.0000 mg | ORAL_TABLET | ORAL | Status: DC | PRN

## 2017-01-16 MED ORDER — OXYCODONE-ACETAMINOPHEN 5-325 MG PO TABS
2.0000 | ORAL_TABLET | ORAL | Status: DC | PRN
Start: 2017-01-16 — End: 2017-01-17

## 2017-01-16 MED ORDER — FENTANYL CITRATE (PF) 100 MCG/2ML IJ SOLN
50.0000 ug | INTRAMUSCULAR | Status: DC | PRN
  Administered 2017-01-16: 50 ug via INTRAVENOUS
  Filled 2017-01-16: qty 2

## 2017-01-16 MED ORDER — FENTANYL 2.5 MCG/ML BUPIVACAINE 1/10 % EPIDURAL INFUSION (WH - ANES)
14.0000 mL/h | INTRAMUSCULAR | Status: DC | PRN
  Administered 2017-01-16 (×2): 14 mL/h via EPIDURAL
  Filled 2017-01-16 (×2): qty 100

## 2017-01-16 MED ORDER — PENICILLIN G POTASSIUM 5000000 UNITS IJ SOLR
5.0000 10*6.[IU] | Freq: Once | INTRAVENOUS | Status: AC
  Administered 2017-01-16: 5 10*6.[IU] via INTRAVENOUS
  Filled 2017-01-16: qty 5

## 2017-01-16 MED ORDER — PHENYLEPHRINE 40 MCG/ML (10ML) SYRINGE FOR IV PUSH (FOR BLOOD PRESSURE SUPPORT)
80.0000 ug | PREFILLED_SYRINGE | INTRAVENOUS | Status: DC | PRN
  Filled 2017-01-16: qty 10

## 2017-01-16 MED ORDER — ONDANSETRON HCL 4 MG/2ML IJ SOLN
INTRAMUSCULAR | Status: AC
  Filled 2017-01-16: qty 4

## 2017-01-16 MED ORDER — DEXTROSE 5 % IV SOLN
INTRAVENOUS | Status: DC | PRN
  Administered 2017-01-16: 3 g via INTRAVENOUS

## 2017-01-16 MED ORDER — FENTANYL CITRATE (PF) 100 MCG/2ML IJ SOLN: 50.0000 ug | INTRAMUSCULAR | Status: DC | PRN

## 2017-01-16 MED ORDER — MORPHINE SULFATE (PF) 0.5 MG/ML IJ SOLN
INTRAMUSCULAR | Status: AC
Start: 2017-01-16 — End: ?
  Filled 2017-01-16: qty 10

## 2017-01-16 MED ORDER — LACTATED RINGERS IV SOLN
500.0000 mL | INTRAVENOUS | Status: DC | PRN
  Administered 2017-01-16: 500 mL via INTRAVENOUS
  Administered 2017-01-16: 1000 mL via INTRAVENOUS

## 2017-01-16 MED ORDER — SOD CITRATE-CITRIC ACID 500-334 MG/5ML PO SOLN
30.0000 mL | ORAL | Status: DC | PRN
  Administered 2017-01-16: 30 mL via ORAL
  Filled 2017-01-16: qty 15

## 2017-01-16 MED ORDER — LIDOCAINE HCL (PF) 1 % IJ SOLN
30.0000 mL | INTRAMUSCULAR | Status: DC | PRN
  Filled 2017-01-16: qty 30

## 2017-01-16 MED ORDER — LACTATED RINGERS IV SOLN
INTRAVENOUS | Status: DC
  Administered 2017-01-16: 21:00:00 via INTRAUTERINE

## 2017-01-16 MED ORDER — DEXTROSE 5 % IV SOLN
INTRAVENOUS | Status: AC
  Filled 2017-01-16: qty 3000

## 2017-01-16 MED ORDER — MEPERIDINE HCL 25 MG/ML IJ SOLN
INTRAMUSCULAR | Status: DC | PRN
  Administered 2017-01-16: 12.5 mg via INTRAVENOUS

## 2017-01-16 MED ORDER — FENTANYL CITRATE (PF) 100 MCG/2ML IJ SOLN
INTRAMUSCULAR | Status: DC | PRN
  Administered 2017-01-16: 100 ug via EPIDURAL
  Administered 2017-01-16 (×2): 50 ug via INTRAVENOUS

## 2017-01-16 MED ORDER — ONDANSETRON HCL 4 MG/2ML IJ SOLN: 4.0000 mg | Freq: Four times a day (QID) | INTRAMUSCULAR | Status: DC | PRN

## 2017-01-16 MED ORDER — DEXAMETHASONE SODIUM PHOSPHATE 4 MG/ML IJ SOLN
INTRAMUSCULAR | Status: AC
  Filled 2017-01-16: qty 1

## 2017-01-16 MED ORDER — PHENYLEPHRINE HCL 10 MG/ML IJ SOLN
INTRAMUSCULAR | Status: DC | PRN
  Administered 2017-01-16: 80 ug via INTRAVENOUS

## 2017-01-16 MED ORDER — DEXTROSE 5 % IV SOLN
3.0000 g | INTRAVENOUS | Status: DC
  Filled 2017-01-16: qty 3000

## 2017-01-16 MED ORDER — OXYTOCIN BOLUS FROM INFUSION: 500.0000 mL | Freq: Once | INTRAVENOUS | Status: DC

## 2017-01-16 SURGICAL SUPPLY — 34 items
BENZOIN TINCTURE PRP APPL 2/3 (GAUZE/BANDAGES/DRESSINGS) ×3 IMPLANT
CHLORAPREP W/TINT 26ML (MISCELLANEOUS) ×3 IMPLANT
CLAMP CORD UMBIL (MISCELLANEOUS) IMPLANT
CLOSURE STERI STRIP 1/2 X4 (GAUZE/BANDAGES/DRESSINGS) ×2 IMPLANT
CLOSURE WOUND 1/2 X4 (GAUZE/BANDAGES/DRESSINGS) ×1
CLOTH BEACON ORANGE TIMEOUT ST (SAFETY) ×3 IMPLANT
DRSG OPSITE POSTOP 4X10 (GAUZE/BANDAGES/DRESSINGS) ×3 IMPLANT
ELECT REM PT RETURN 9FT ADLT (ELECTROSURGICAL) ×3
ELECTRODE REM PT RTRN 9FT ADLT (ELECTROSURGICAL) ×1 IMPLANT
EXTRACTOR VACUUM M CUP 4 TUBE (SUCTIONS) IMPLANT
EXTRACTOR VACUUM M CUP 4' TUBE (SUCTIONS)
GAUZE SPONGE 4X4 12PLY STRL LF (GAUZE/BANDAGES/DRESSINGS) ×6 IMPLANT
GLOVE BIOGEL PI IND STRL 7.0 (GLOVE) ×2 IMPLANT
GLOVE BIOGEL PI IND STRL 7.5 (GLOVE) ×2 IMPLANT
GLOVE BIOGEL PI INDICATOR 7.0 (GLOVE) ×4
GLOVE BIOGEL PI INDICATOR 7.5 (GLOVE) ×4
GLOVE ECLIPSE 7.5 STRL STRAW (GLOVE) ×3 IMPLANT
GOWN STRL REUS W/TWL LRG LVL3 (GOWN DISPOSABLE) ×9 IMPLANT
KIT ABG SYR 3ML LUER SLIP (SYRINGE) IMPLANT
NEEDLE HYPO 25X5/8 SAFETYGLIDE (NEEDLE) IMPLANT
NS IRRIG 1000ML POUR BTL (IV SOLUTION) ×3 IMPLANT
PACK C SECTION WH (CUSTOM PROCEDURE TRAY) ×3 IMPLANT
PAD ABD 7.5X8 STRL (GAUZE/BANDAGES/DRESSINGS) ×3 IMPLANT
PAD OB MATERNITY 4.3X12.25 (PERSONAL CARE ITEMS) ×3 IMPLANT
PENCIL SMOKE EVAC W/HOLSTER (ELECTROSURGICAL) ×3 IMPLANT
RTRCTR C-SECT PINK 25CM LRG (MISCELLANEOUS) ×3 IMPLANT
STRIP CLOSURE SKIN 1/2X4 (GAUZE/BANDAGES/DRESSINGS) ×2 IMPLANT
SUT VIC AB 0 CTX 36 (SUTURE) ×6
SUT VIC AB 0 CTX36XBRD ANBCTRL (SUTURE) ×3 IMPLANT
SUT VIC AB 2-0 CT1 27 (SUTURE) ×2
SUT VIC AB 2-0 CT1 TAPERPNT 27 (SUTURE) ×1 IMPLANT
SUT VIC AB 4-0 KS 27 (SUTURE) ×3 IMPLANT
TOWEL OR 17X24 6PK STRL BLUE (TOWEL DISPOSABLE) ×3 IMPLANT
TRAY FOLEY BAG SILVER LF 14FR (SET/KITS/TRAYS/PACK) ×3 IMPLANT

## 2017-01-16 NOTE — Progress Notes (Signed)
Labor Progress Note Arvid RightBridget M Barreras is a 26 y.o. G2P0010 at 6661w2d presented for SROM S: Patient feeling more uncomfortable  O:  BP 137/72   Pulse 67   Temp 98.6 F (37 C) (Oral)   Resp (!) 24   Ht 5' 5.5" (1.664 m)   Wt 269 lb (122 kg)   SpO2 98%   BMI 44.08 kg/m  EFM: 150/mod var/late decels/pos acels  CVE: Dilation: 4 Effacement (%): 80 Cervical Position: Posterior Station: -1, -2 Presentation: Vertex Exam by:: M.Lee, RNC   A&P: 26 y.o. G2P0010 7161w2d here for SROM #Labor: continue pitocin #Pain: planning epidural #FWB: cat 2 #GBS positive   Orpheus Hayhurst, DO 11:59 AM

## 2017-01-16 NOTE — Anesthesia Pain Management Evaluation Note (Signed)
  CRNA Pain Management Visit Note  Patient: Rachel Gregory, 26 y.o., female  "Hello I am a [facility] of the anesthesia [facility]. We have an anesthesia team available at all times to provide care throughout the hospital, including epidural management and anesthesia for C-section. I don't know your plan for the delivery whether it a natural birth, water birth, IV sedation, nitrous supplementation, doula or epidural, but we want to meet your pain goals."   1.Was your pain managed to your expectations on prior hospitalizations?   No prior hospitalizations  2.What is your expectation for pain management during this hospitalization?     IV pain meds  3.How can we help you reach that goal? Patient asked for IV pain medication.  Record the patient's initial score and the patient's pain goal.   Pain: 9  Pain Goal: 6 [facility] wants you to be able to say your pain was always managed very well.  Rachel Gregory 01/16/2017

## 2017-01-16 NOTE — Anesthesia Preprocedure Evaluation (Signed)
Anesthesia Evaluation  Patient identified by MRN, date of birth, ID band Patient awake    Reviewed: Allergy & Precautions, Patient's Chart, lab work & pertinent test results  Airway Mallampati: III  TM Distance: >3 FB Neck ROM: Full    Dental no notable dental hx. (+) Teeth Intact   Pulmonary asthma , Current Smoker,    Pulmonary exam normal breath sounds clear to auscultation       Cardiovascular negative cardio ROS Normal cardiovascular exam Rhythm:Regular Rate:Normal     Neuro/Psych PSYCHIATRIC DISORDERS Depression Bipolar Disorder negative neurological ROS     GI/Hepatic Neg liver ROS, GERD  Medicated and Controlled,  Endo/Other  Morbid obesity  Renal/GU negative Renal ROS  negative genitourinary   Musculoskeletal negative musculoskeletal ROS (+)   Abdominal (+) + obese,   Peds  Hematology   Anesthesia Other Findings   Reproductive/Obstetrics (+) Pregnancy                             Anesthesia Physical Anesthesia Plan  ASA: III  Anesthesia Plan: Epidural   Post-op Pain Management:    Induction:   PONV Risk Score and Plan: 1  Airway Management Planned: Natural Airway  Additional Equipment:   Intra-op Plan:   Post-operative Plan:   Informed Consent: I have reviewed the patients History and Physical, chart, labs and discussed the procedure including the risks, benefits and alternatives for the proposed anesthesia with the patient or authorized representative who has indicated his/her understanding and acceptance.     Plan Discussed with: Anesthesiologist  Anesthesia Plan Comments:         Anesthesia Quick Evaluation

## 2017-01-16 NOTE — Progress Notes (Signed)
Patient ID: Rachel Gregory, female   DOB: Apr 19, 1990, 26 y.o.   MRN: [id number]  Patient having prolonged decels when increasing pitocin. Patient opting for cesarean delivery at this time. The risks of cesarean section discussed with the patient included but were not limited to: bleeding which may require transfusion or reoperation; infection which may require antibiotics; injury to bowel, bladder, ureters or other surrounding organs; injury to the fetus; need for additional procedures including hysterectomy in the event of a life-threatening hemorrhage; placental abnormalities wth subsequent pregnancies, incisional problems, thromboembolic phenomenon and other postoperative/anesthesia complications. The patient concurred with the proposed plan, giving informed written consent for the procedure.   Patient has been NPO since this morning she will remain NPO for procedure. Anesthesia and OR aware.  Preoperative prophylactic Ancef ordered on call to the OR.  To OR when ready.  [name], Jacob J, DO 01/16/2017 10:12 PM

## 2017-01-16 NOTE — H&P (Signed)
Obstetric History and Physical  Rachel Gregory is a 26 y.o. G2P0010 with IUP at 6373w2d presenting for SROM at home at 0600. Patient states she has been having  none contractions, none vaginal bleeding, ruptured membranes, with active fetal movement.    Prenatal Course [facility]  Pregnancy complications or risks: Patient Active Problem List   Diagnosis Date Noted  . Indication for care in labor or delivery 01/16/2017  . Excessive weight gain in pregnancy 10/02/2016  . Nausea and vomiting of pregnancy, antepartum 06/16/2016  . Tobacco abuse 06/15/2016  . Supervision of low-risk pregnancy 06/15/2016  . Obesity in pregnancy 06/15/2016  . Obesity (BMI 35.0-39.9 without comorbidity) 06/15/2016  . Endometriosis 05/24/2016   She plans to breastfeed She desires nexplanon for postpartum contraception.   Clinic  [facility] Prenatal Labs  Dating  8wk u/s  Blood type: A/Positive/-- (04/05 1101) A pos  Genetic Screen 1 Screen:  Neg  AFP: NIPS:  Antibody:Negative (04/05 1101)neg  Anatomic US  normal female, anterior placenta >needs completion anatomy u/s late july/early august >nml but limited after 2 attempts Rubella: 2.54 (04/05 1101)imm  GDM screening Early:  Normal 2 hr GTT 24-28wks:  RPR: Non Reactive (04/05 1101) neg  Flu vaccine 10/24/16 HBsAg: Negative (04/05 1101) neg  TDaP vaccine  Given 8/21 HIV:   neg  Baby Food  breast                                GBS:  12/27/16 - POSITIVE  Contraception  Nexplanon Pap: neg 2018  Circumcision  yes- outpt   Pediatrician [facility]   Support Person  Christiane HaJonathan (fiance)      Prenatal Transfer Tool  Maternal Diabetes: No Genetic Screening: Normal Maternal Ultrasounds/Referrals: Normal Fetal Ultrasounds or other Referrals:  None Maternal Substance Abuse:  No Significant Maternal Medications:  None Significant Maternal Lab Results: Lab values include: Group B Strep positive  Past Medical History:  Diagnosis Date  . ADHD (attention  deficit hyperactivity disorder)   . Allergy   . Asthma   . Bipolar 1 disorder (HCC)   . Depression   . Endometriosis   . Plantar fasciitis of right foot     Past Surgical History:  Procedure Laterality Date  . DILATION AND CURETTAGE OF UTERUS  04/2015   spontaneous abortion    OB History  Gravida Para Term Preterm AB Living  2       1    SAB TAB Ectopic Multiple Live Births  1            # Outcome Date GA Lbr Len/2nd Weight Sex Delivery Anes PTL Lv  2 Current           1 SAB 03/2015              Social History   Socioeconomic History  . Marital status: Single    Spouse name: None  . Number of children: None  . Years of education: None  . Highest education level: None  Social Needs  . Financial resource strain: None  . Food insecurity - worry: None  . Food insecurity - inability: None  . Transportation needs - medical: None  . Transportation needs - non-medical: None  Occupational History  . None  Tobacco Use  . Smoking status: Current Every Day Smoker    Packs/day: 0.25  . Smokeless tobacco: Never Used  Substance and Sexual Activity  .  Alcohol use: Yes    Comment: occ not since pregnant  . Drug use: No  . Sexual activity: Yes    Birth control/protection: None  Other Topics Concern  . None  Social History Narrative  . None    Family History  Problem Relation Age of Onset  . Miscarriages / [place] Mother   . Arthritis Father   . Miscarriages / Stillbirths Maternal Aunt   . Birth defects Maternal Uncle        congenital heart defect  . Birth defects Paternal Uncle        spina bifida  . Miscarriages / Stillbirths Maternal Grandmother   . Miscarriages / Stillbirths Paternal Grandmother     Medications Prior to Admission  Medication Sig Dispense Refill Last Dose  . ondansetron (ZOFRAN-ODT) 4 MG disintegrating tablet Take 1 tablet (4 mg total) by mouth every 8 (eight) hours as needed for nausea or vomiting. 30 tablet 0 Past Month at Unknown time   . Prenatal Vit-Fe Fumarate-FA (PREPLUS) 27-1 MG TABS   1 01/15/2017 at Unknown time  . ranitidine (ZANTAC) 150 MG tablet Take 1 tablet (150 mg total) by mouth at bedtime. 30 tablet 0 01/15/2017 at Unknown time    Allergies  Allergen Reactions  . Bee Venom Hives and Swelling    Review of Systems: Negative except for what is mentioned in HPI.  Physical Exam: BP 128/77   Pulse 65   Temp 98.1 F (36.7 C) (Oral)   Resp (!) 24   Ht 5' 5.5" (1.664 m)   Wt 269 lb (122 kg)   SpO2 98%   BMI 44.08 kg/m  CONSTITUTIONAL: Well-developed, well-nourished female in no acute distress.  HENT:  Normocephalic, atraumatic, External right and left ear normal. Oropharynx is clear and moist EYES: Conjunctivae and EOM are normal. Pupils are equal, round, and reactive to light. No scleral icterus.  NECK: Normal range of motion, supple, no masses SKIN: Skin is warm and dry. No rash noted. Not diaphoretic. No erythema. No pallor. NEUROLOGIC: Alert and oriented to person, place, and time. Normal reflexes, muscle tone coordination. No cranial nerve deficit noted. PSYCHIATRIC: Normal mood and affect. Normal behavior. Normal judgment and thought content. CARDIOVASCULAR: Normal heart rate noted, regular rhythm RESPIRATORY: Effort and breath sounds normal, no problems with respiration noted ABDOMEN: Soft, nontender, nondistended, gravid. MUSCULOSKELETAL: Normal range of motion. No edema and no tenderness. 2+ distal pulses.  Cervical Exam: Dilatation 4cm   Effacement 80%   Station -1   Presentation: cephalic FHT:  Baseline rate 130 bpm   Variability moderate  Accelerations present   Decelerations none Contractions: Every 4 mins   Assessment : Rachel Gregory is a 26 y.o. G2P0010 at 5638w2d being admitted for SROM and latent labor.  Plan: Labor: Augmentation as ordered as per protocol. Analgesia as needed. FWB: Reassuring fetal heart tracing.  GBS positive- PCN to treat Delivery plan: Hopeful for vaginal  delivery MOF: breast MOC: nexplanon  Rolm BookbinderAmber Lupe Handley, DO

## 2017-01-16 NOTE — Op Note (Signed)
Rachel Gregory PROCEDURE DATE: 01/16/2017  PREOPERATIVE DIAGNOSIS: Intrauterine pregnancy at  4471w2d weeks gestation; fetal indications  POSTOPERATIVE DIAGNOSIS: The same  PROCEDURE: Primary Low Transverse Cesarean Section  SURGEON:  Dr. Candelaria CelesteJacob Nazir Gregory  ASSISTANT: none  INDICATIONS: Rachel Gregory is a 26 y.o. G2P0010 at 4571w2d scheduled for cesarean section secondary to fetal indication.  The risks of cesarean section discussed with the patient included but were not limited to: bleeding which may require transfusion or reoperation; infection which may require antibiotics; injury to bowel, bladder, ureters or other surrounding organs; injury to the fetus; need for additional procedures including hysterectomy in the event of a life-threatening hemorrhage; placental abnormalities wth subsequent pregnancies, incisional problems, thromboembolic phenomenon and other postoperative/anesthesia complications. The patient concurred with the proposed plan, giving informed written consent for the procedure.    FINDINGS:  Viable female infant in vertex, OP presentation.  Apgars 8 and 9, weight pending.  Clear amniotic fluid.  Intact placenta, three vessel cord.  Normal uterus, fallopian tubes and ovaries bilaterally.  ANESTHESIA:    Spinal INTRAVENOUS FLUIDS:2300 ml ESTIMATED BLOOD LOSS: 750 ml URINE OUTPUT:  100 ml SPECIMENS: Placenta sent to L&D COMPLICATIONS: None immediate  PROCEDURE IN DETAIL:  The patient received intravenous antibiotics and had sequential compression devices applied to her lower extremities while in the preoperative area.  She was then taken to the operating room where epidural anesthesia was dosed up to surgical level and was found to be adequate. She was then placed in a dorsal supine position with a leftward tilt, and prepped and draped in a sterile manner.  A foley catheter had already been placed into her bladder and attached to constant gravity, which drained clear fluid  throughout.  After an adequate timeout was performed, a Pfannenstiel skin incision was made with scalpel and carried through to the underlying layer of fascia. The fascia was incised in the midline and this incision was extended bilaterally using the Mayo scissors. Kocher clamps were applied to the superior aspect of the fascial incision and the underlying rectus muscles were dissected off bluntly. A similar process was carried out on the inferior aspect of the facial incision. The rectus muscles were separated in the midline bluntly and the peritoneum was entered bluntly. An Alexis retractor was placed to aid in visualization of the uterus.  Attention was turned to the lower uterine segment where a transverse hysterotomy was made with a scalpel and extended bilaterally bluntly. The infant was successfully delivered, and cord was clamped and cut and infant was handed over to awaiting neonatology team. Uterine massage was then administered and the placenta delivered intact with three-vessel cord. The uterus was then cleared of clot and debris.  The hysterotomy was closed with 0 Vicryl in a running locked fashion, and an imbricating layer was also placed with a 0 Vicryl. Overall, excellent hemostasis was noted. The abdomen and the pelvis were cleared of all clot and debris and the Jon Gillslexis was removed. Hemostasis was confirmed on all surfaces.  The peritoneum was reapproximated using 2-0 vicryl running stitches. The fascia was then closed using 0 Vicryl in a running fashion. The skin was closed with 4-0 vicryl. The patient tolerated the procedure well. Sponge, lap, instrument and needle counts were correct x 2. She was taken to the recovery room in stable condition.    Rachel Gregory, Rachel Roppolo J, DO 01/16/2017 11:41 PM

## 2017-01-16 NOTE — Anesthesia Procedure Notes (Signed)
Epidural Patient location during procedure: OB Start time: 01/16/2017 12:51 PM  Staffing Anesthesiologist: Mal AmabileFoster, Krystal Delduca, MD Performed: anesthesiologist   Preanesthetic Checklist Completed: patient identified, site marked, surgical consent, pre-op evaluation, timeout performed, IV checked, risks and benefits discussed and monitors and equipment checked  Epidural Patient position: sitting Prep: site prepped and draped and DuraPrep Patient monitoring: continuous pulse ox and blood pressure Approach: midline Location: L3-L4 Injection technique: LOR air  Needle:  Needle type: Tuohy  Needle gauge: 17 G Needle length: 9 cm and 9 Needle insertion depth: 8 cm Catheter type: closed end flexible Catheter size: 19 Gauge Catheter at skin depth: 13 cm Test dose: negative and Other  Assessment Events: blood not aspirated, injection not painful, no injection resistance, negative IV test and no paresthesia  Additional Notes Patient identified. Risks and benefits discussed including failed block, incomplete  Pain control, post dural puncture headache, nerve damage, paralysis, blood pressure Changes, nausea, vomiting, reactions to medications-both toxic and allergic and post Partum back pain. All questions were answered. Patient expressed understanding and wished to proceed. Sterile technique was used throughout procedure. Epidural site was Dressed with sterile barrier dressing. No paresthesias, signs of intravascular injection Or signs of intrathecal spread were encountered.  Patient was more comfortable after the epidural was dosed. Please see RN's note for documentation of vital signs and FHR which are stable.

## 2017-01-16 NOTE — Progress Notes (Addendum)
Labor Progress Note Rachel Gregory is a 26 y.o. G2P0010 at 9357w2d presented for SROM. S: Pt comfortable in bed with oxygen mask due to concerning fetal strip.  O:  BP (!) 96/41   Pulse (!) 58   Temp 99.2 F (37.3 C) (Oral)   Resp 20   Ht 5' 5.5" (1.664 m)   Wt 269 lb (122 kg)   SpO2 100%   BMI 44.08 kg/m  EFM:  Strip showed prolonged decels to 60s. After recovery measures, strip currently 140/mod vari/+accels  CVE: Dilation: 5.5 Effacement (%): 80 Cervical Position: Posterior Station: 0 Presentation: Vertex Exam by:: Rachel Gregory CNM    A&P: 26 y.o. G2P0010 4657w2d here for SROM #Labor: Fetal strip continued to show late and variable decels. Patient was given oxygen, head of bed lowered and position changed.  Strip improved with pitocin stopped and terbutiline administered currently (140/mod vari/ +accels). Pt made aware of current situation with baby and given options of next steps (retry pitocin at slower rate increase or c-section). Patient decided on retrying pitocin at slower rate at this time.  Pitocin restarted at 1mill and to increase at 0.5 increments. #Pain: epidural #FWB: cat1/cat 3/cat 1 #GBS positive PCN   Rachel BroadKeriann S Minott, MD 9:17 PM   Called by RN d/t prolonged decel.  Pt comfortable w/ epidural, no complaints FHR in 60-70s, pitocin had already been turned off before we were called, was at 932mu/min. Position changes/O2/IV fluid bolus/amnioinfusion in process. SVE 5.5/80/0. Administered terbutaline 0.25mg  [place]. Dr. Adrian BlackwaterStinson called to come to room. FHR returned to baseline after app 6min prolonged. Baseline 135, mod variability, +accels, no decels=Cat1. Dr. Adrian BlackwaterStinson discussed options of restarting pitocin slowly after allowing baby to rest for 20-2330mins vs. C/S. Pt opted for restarting pitocin.  [name], CNM, [facility]NP-BC 01/16/17 2130

## 2017-01-16 NOTE — MAU Note (Signed)
Pt reports ? rom at 0600, lower back pain.

## 2017-01-16 NOTE — Progress Notes (Signed)
Labor Progress Note Rachel Gregory is a 26 y.o. G2P0010 at 10837w2d presented for SROM S: No complaints  O:  BP (!) 115/52   Pulse (!) 58   Temp 99.1 F (37.3 C) (Oral)   Resp 18   Ht 5' 5.5" (1.664 m)   Wt 269 lb (122 kg)   SpO2 100%   BMI 44.08 kg/m  EFM: 130 bpm/mod var/pos acels/ no decels  CVE: Dilation: 6 Effacement (%): 80 Cervical Position: Posterior Station: 0 Presentation: Vertex Exam by:: Corie ChiquitoM. Lee, RNC   A&P: 26 y.o. G2P0010 837w2d here for SROM #Labor: Cervical exam unchanged since this morning but unable to increase pitocin all day due to late and variable decelerations. Pitocin now at 1.5Mu/min and contracting every 3 min. IUPC and FSE in place. Continue pitocin at current rate.   Rachel Nicklin CrowderMoss, DO 6:04 PM

## 2017-01-16 NOTE — MAU Note (Signed)
Pt presents with of LOF, began @ 0600, reports as clear.  Denies ctxs.  Reports +FM.

## 2017-01-16 NOTE — Progress Notes (Addendum)
Dr. Adrian BlackwaterStinson at bedside discussing risks and benefits of C/S with patient. Questions answered. Consents signed

## 2017-01-16 NOTE — Progress Notes (Signed)
Labor Progress Note Rachel Gregory is a 26 y.o. G2P0010 at 560w2d presented for SROM S: No complaints  O:  BP 125/68   Pulse 62   Temp 98.6 F (37 C) (Oral)   Resp 20   Ht 5' 5.5" (1.664 m)   Wt 269 lb (122 kg)   SpO2 100%   BMI 44.08 kg/m  EFM: 130/mod var/pos acels/late and variable decels  CVE: Dilation: 6 Effacement (%): 80 Cervical Position: Posterior Station: -1 Presentation: Vertex Exam by:: Dr. Rachelle HoraMoss   A&P: 26 y.o. G2P0010 5060w2d here for SROM #Labor: Progressing well. Pitocin turned off due to late and variable decels. Placed IUPC and FSE #Pain: epidural #FWB: cat 2 #GBS positive   Rachel Willmore, DO 2:28 PM

## 2017-01-17 ENCOUNTER — Encounter: Payer: Medicaid Other | Admitting: Obstetrics and Gynecology

## 2017-01-17 ENCOUNTER — Encounter (HOSPITAL_COMMUNITY): Payer: Self-pay | Admitting: *Deleted

## 2017-01-17 LAB — CBC
HCT: 32.6 % — ABNORMAL LOW (ref 36.0–46.0)
HEMATOCRIT: 34.5 % — AB (ref 36.0–46.0)
Hemoglobin: 11.3 g/dL — ABNORMAL LOW (ref 12.0–15.0)
Hemoglobin: 11 g/dL — ABNORMAL LOW (ref 12.0–15.0)
MCH: 28.1 pg (ref 26.0–34.0)
MCH: 28.9 pg (ref 26.0–34.0)
MCHC: 32.8 g/dL (ref 30.0–36.0)
MCHC: 33.7 g/dL (ref 30.0–36.0)
MCV: 85.8 fL (ref 78.0–100.0)
MCV: 85.6 fL (ref 78.0–100.0)
PLATELETS: 141 10*3/uL — AB (ref 150–400)
Platelets: 155 10*3/uL (ref 150–400)
RBC: 4.02 MIL/uL (ref 3.87–5.11)
RBC: 3.81 MIL/uL — ABNORMAL LOW (ref 3.87–5.11)
RDW: 15.1 % (ref 11.5–15.5)
RDW: 15 % (ref 11.5–15.5)
WBC: 16.3 10*3/uL — ABNORMAL HIGH (ref 4.0–10.5)
WBC: 16 10*3/uL — ABNORMAL HIGH (ref 4.0–10.5)

## 2017-01-17 MED ORDER — DIBUCAINE 1 % RE OINT: 1.0000 "application " | TOPICAL_OINTMENT | RECTAL | Status: DC | PRN

## 2017-01-17 MED ORDER — KETOROLAC TROMETHAMINE 30 MG/ML IJ SOLN: 30.0000 mg | Freq: Four times a day (QID) | INTRAMUSCULAR | Status: DC | PRN

## 2017-01-17 MED ORDER — MEPERIDINE HCL 25 MG/ML IJ SOLN
6.2500 mg | INTRAMUSCULAR | Status: DC | PRN
Start: 2017-01-17 — End: 2017-01-17

## 2017-01-17 MED ORDER — PRENATAL MULTIVITAMIN CH
1.0000 | ORAL_TABLET | Freq: Every day | ORAL | Status: DC
  Administered 2017-01-17 – 2017-01-18 (×2): 1 via ORAL
  Filled 2017-01-17 (×2): qty 1

## 2017-01-17 MED ORDER — DIPHENHYDRAMINE HCL 50 MG/ML IJ SOLN: 12.5000 mg | INTRAMUSCULAR | Status: DC | PRN

## 2017-01-17 MED ORDER — OXYCODONE-ACETAMINOPHEN 5-325 MG PO TABS
1.0000 | ORAL_TABLET | ORAL | Status: DC | PRN
Start: 2017-01-17 — End: 2017-01-19
  Administered 2017-01-19: 1 via ORAL
  Filled 2017-01-17: qty 1

## 2017-01-17 MED ORDER — PROMETHAZINE HCL 25 MG/ML IJ SOLN: 6.2500 mg | INTRAMUSCULAR | Status: DC | PRN

## 2017-01-17 MED ORDER — TETANUS-DIPHTH-ACELL PERTUSSIS 5-2.5-18.5 LF-MCG/0.5 IM SUSP: 0.5000 mL | Freq: Once | INTRAMUSCULAR | Status: DC

## 2017-01-17 MED ORDER — MEPERIDINE HCL 25 MG/ML IJ SOLN: 6.2500 mg | INTRAMUSCULAR | Status: DC | PRN

## 2017-01-17 MED ORDER — KETOROLAC TROMETHAMINE 30 MG/ML IJ SOLN
INTRAMUSCULAR | Status: AC
  Administered 2017-01-17: 30 mg
  Filled 2017-01-17: qty 1

## 2017-01-17 MED ORDER — SENNOSIDES-DOCUSATE SODIUM 8.6-50 MG PO TABS
2.0000 | ORAL_TABLET | ORAL | Status: DC
  Administered 2017-01-17 – 2017-01-18 (×2): 2 via ORAL
  Filled 2017-01-17 (×3): qty 2

## 2017-01-17 MED ORDER — SIMETHICONE 80 MG PO CHEW
80.0000 mg | CHEWABLE_TABLET | ORAL | Status: DC
  Administered 2017-01-18: 80 mg via ORAL
  Filled 2017-01-17 (×2): qty 1

## 2017-01-17 MED ORDER — KETOROLAC TROMETHAMINE 30 MG/ML IJ SOLN
30.0000 mg | Freq: Once | INTRAMUSCULAR | Status: DC | PRN
Start: 2017-01-17 — End: 2017-01-17

## 2017-01-17 MED ORDER — ACETAMINOPHEN 325 MG PO TABS: 650.0000 mg | ORAL_TABLET | ORAL | Status: DC | PRN

## 2017-01-17 MED ORDER — NALOXONE HCL 0.4 MG/ML IJ SOLN: 0.4000 mg | INTRAMUSCULAR | Status: DC | PRN

## 2017-01-17 MED ORDER — NALBUPHINE HCL 10 MG/ML IJ SOLN: 5.0000 mg | Freq: Once | INTRAMUSCULAR | Status: DC | PRN

## 2017-01-17 MED ORDER — LACTATED RINGERS IV SOLN
INTRAVENOUS | Status: DC
  Administered 2017-01-17: 1 mL via INTRAVENOUS

## 2017-01-17 MED ORDER — ONDANSETRON HCL 4 MG/2ML IJ SOLN: 4.0000 mg | Freq: Three times a day (TID) | INTRAMUSCULAR | Status: DC | PRN

## 2017-01-17 MED ORDER — NALBUPHINE HCL 10 MG/ML IJ SOLN: 5.0000 mg | INTRAMUSCULAR | Status: DC | PRN

## 2017-01-17 MED ORDER — SIMETHICONE 80 MG PO CHEW
80.0000 mg | CHEWABLE_TABLET | Freq: Three times a day (TID) | ORAL | Status: DC
  Administered 2017-01-17 – 2017-01-19 (×6): 80 mg via ORAL
  Filled 2017-01-17 (×6): qty 1

## 2017-01-17 MED ORDER — WITCH HAZEL-GLYCERIN EX PADS: 1.0000 "application " | MEDICATED_PAD | CUTANEOUS | Status: DC | PRN

## 2017-01-17 MED ORDER — SCOPOLAMINE 1 MG/3DAYS TD PT72
1.0000 | MEDICATED_PATCH | Freq: Once | TRANSDERMAL | Status: DC
  Filled 2017-01-17: qty 1

## 2017-01-17 MED ORDER — COCONUT OIL OIL
1.0000 | TOPICAL_OIL | Status: DC | PRN
Start: 2017-01-17 — End: 2017-01-19
  Administered 2017-01-18: 1 via TOPICAL
  Filled 2017-01-17: qty 120

## 2017-01-17 MED ORDER — DIPHENHYDRAMINE HCL 25 MG PO CAPS: 25.0000 mg | ORAL_CAPSULE | ORAL | Status: DC | PRN

## 2017-01-17 MED ORDER — ENOXAPARIN SODIUM 40 MG/0.4ML ~~LOC~~ SOLN
40.0000 mg | SUBCUTANEOUS | Status: DC
  Administered 2017-01-18 – 2017-01-19 (×2): 40 mg via SUBCUTANEOUS
  Filled 2017-01-17 (×2): qty 0.4

## 2017-01-17 MED ORDER — OXYCODONE-ACETAMINOPHEN 5-325 MG PO TABS
2.0000 | ORAL_TABLET | ORAL | Status: DC | PRN
  Administered 2017-01-17 – 2017-01-19 (×7): 2 via ORAL
  Filled 2017-01-17 (×7): qty 2

## 2017-01-17 MED ORDER — SIMETHICONE 80 MG PO CHEW
80.0000 mg | CHEWABLE_TABLET | ORAL | Status: DC | PRN
  Administered 2017-01-18: 80 mg via ORAL
  Filled 2017-01-17: qty 1

## 2017-01-17 MED ORDER — NALOXONE HCL 2 MG/2ML IJ SOSY
1.0000 ug/kg/h | PREFILLED_SYRINGE | INTRAMUSCULAR | Status: DC | PRN
  Filled 2017-01-17: qty 2

## 2017-01-17 MED ORDER — ZOLPIDEM TARTRATE 5 MG PO TABS: 5.0000 mg | ORAL_TABLET | Freq: Every evening | ORAL | Status: DC | PRN

## 2017-01-17 MED ORDER — IBUPROFEN 600 MG PO TABS
600.0000 mg | ORAL_TABLET | Freq: Four times a day (QID) | ORAL | Status: DC
  Administered 2017-01-17 – 2017-01-19 (×8): 600 mg via ORAL
  Filled 2017-01-17 (×9): qty 1

## 2017-01-17 MED ORDER — MENTHOL 3 MG MT LOZG: 1.0000 | LOZENGE | OROMUCOSAL | Status: DC | PRN

## 2017-01-17 MED ORDER — SODIUM CHLORIDE 0.9% FLUSH: 3.0000 mL | INTRAVENOUS | Status: DC | PRN

## 2017-01-17 MED ORDER — DIPHENHYDRAMINE HCL 25 MG PO CAPS: 25.0000 mg | ORAL_CAPSULE | Freq: Four times a day (QID) | ORAL | Status: DC | PRN

## 2017-01-17 MED ORDER — ACETAMINOPHEN 500 MG PO TABS
1000.0000 mg | ORAL_TABLET | Freq: Four times a day (QID) | ORAL | Status: DC
  Administered 2017-01-17 (×2): 1000 mg via ORAL
  Filled 2017-01-17 (×3): qty 2

## 2017-01-17 MED ORDER — OXYTOCIN 40 UNITS IN LACTATED RINGERS INFUSION - SIMPLE MED: 2.5000 [IU]/h | INTRAVENOUS | Status: DC

## 2017-01-17 MED ORDER — HYDROMORPHONE HCL 1 MG/ML IJ SOLN: 0.2500 mg | INTRAMUSCULAR | Status: DC | PRN

## 2017-01-17 NOTE — Progress Notes (Signed)
Post Partum Day 1 Subjective: no complaints, up ad lib, voiding, tolerating PO and + flatus. Patient only expresses concern due to her baby being taken to the NICU due to low blood sugar and not feeding well. Offered reassurance that her baby would get the care he needs in NICU and would be watched carefully.  Objective: Blood pressure 131/86, pulse 60, temperature (!) 97.5 F (36.4 C), temperature source Oral, resp. rate 18, height 5' 5.5" (1.664 m), weight 269 lb (122 kg), SpO2 100 %, unknown if currently breastfeeding.  Physical Exam:  General: alert, cooperative and no distress Lochia: appropriate Uterine Fundus: firm Incision: healing well, no significant drainage, no dehiscence, no significant erythema DVT Evaluation: No evidence of DVT seen on physical exam. No cords or calf tenderness, SCDs in place.  Recent Labs    01/16/17 0824  HGB 12.2  HCT 36.2    Assessment/Plan: Plan for discharge tomorrow, Breastfeeding, Lactation consult and Contraception Nexplanon   LOS: 1 day   Arlyce Harmanimothy Sandeep Radell 01/17/2017, 9:42 AM

## 2017-01-17 NOTE — Lactation Note (Signed)
This note was copied from a baby's chart. Lactation Consultation Note  Patient Name: Rachel Gregory Baby at 17 hr of life and transferred to the NICU. Family was not in room 120 at this time. The [facility] was set up and appeared to have been used. Lactation to f/u with family later.    Rachel Gregory Gregory, 4:25 PM

## 2017-01-17 NOTE — Plan of Care (Signed)
Oriented to room, call bell, and booklet.

## 2017-01-17 NOTE — Progress Notes (Signed)
CSW attempted to meet with MOB twice today, but she was not in her room at these times.  CSW will attempt again to meet with MOB at a later time to offer support and complete assessment due to Bipolar dx and baby's admission to NICU.

## 2017-01-17 NOTE — Anesthesia Postprocedure Evaluation (Signed)
Anesthesia Post Note  Patient: Rachel Gregory  Procedure(s) Performed: CESAREAN SECTION (N/A )     Patient location during evaluation: Mother Baby Anesthesia Type: Epidural Level of consciousness: awake Pain management: pain level controlled Vital Signs Assessment: post-procedure vital signs reviewed and stable Respiratory status: spontaneous breathing Cardiovascular status: stable Postop Assessment: no headache, epidural receding and patient able to bend at knees Anesthetic complications: no    Last Vitals:  Vitals:   01/17/17 0542 01/17/17 0630  BP: 131/86   Pulse: 60   Resp: 18   Temp: (!) 36.4 C   SpO2: 97% 97%    Last Pain:  Vitals:   01/17/17 0542  TempSrc: Oral  PainSc: 2    Pain Goal:                 Edison PaceWILKERSON,Amani Nodarse

## 2017-01-17 NOTE — Transfer of Care (Signed)
Immediate Anesthesia Transfer of Care Note  Patient: Rachel Gregory  Procedure(s) Performed: CESAREAN SECTION (N/A )  Patient Location: PACU  Anesthesia Type:Epidural  Level of Consciousness: awake, alert  and oriented  Airway & Oxygen Therapy: Patient Spontanous Breathing  Post-op Assessment: Report given to RN and Post -op Vital signs reviewed and stable  Post vital signs: Reviewed and stable BP 118/50, HR 77, RR 20, SaO2 100%  Last Vitals:  Vitals:   01/16/17 2131 01/16/17 2201  BP: (!) 116/42 100/60  Pulse: 66 74  Resp: 18 18  Temp: 37.2 C   SpO2:      Last Pain:  Vitals:   01/16/17 2131  TempSrc: Oral  PainSc:          Complications: No apparent anesthesia complications

## 2017-01-17 NOTE — Lactation Note (Addendum)
This note was copied from a baby's chart.  Lactation Consultation Note New mom, c-section, baby hypoglycemic less than 20. Baby spitty, gagging, mouth chewing.spit up glucose. Has no interest in BF, formula feeding, suckling on gloved finger or spoon feeding. Baby had large mucous emesis. Thick. Attempted to given glucose in cheeks, took a little. Spoon fed formula, 22 cal Similac. Mom holding STS. Strongly encouraged. Hand expresses no colostrum. Mom has small nipples, semi flat, very short shaft. DEBP taken to rm. Asked RN to set up.  Mom states she may end up pumping if baby doesn't want to BF. Encouraged mom to give baby time to learn to feed.  Mom encouraged to feed baby 8-12 times/24 hours and with feeding cues. Discussed Newborn behavior, feeding habits, STS, I&O, cluster feeding. Since baby is 5.7lbs baby needs to conserve calories, not to feed longer than 30 min, keep hat on head, and dress warm, strict I&O, after BF then pump and hand express to supplement according to hours of age.  Baby looking jittery, encouraged mom to alert RN if notices changes in baby. WH/LC brochure given w/resources, support groups and LC services. Patient Name: Rachel Camillia HerterBridget Utke ZOXWR'UToday's Date: 01/17/2017 Reason for consult: Initial assessment   Maternal Data Has patient been taught Hand Expression?: Yes Does the patient have breastfeeding experience prior to this delivery?: No  Feeding Feeding Type: Formula Length of feed: 0 min  LATCH Score Latch: Too sleepy or reluctant, no latch achieved, no sucking elicited.  Audible Swallowing: None  Type of Nipple: Flat  Comfort (Breast/Nipple): Soft / non-tender        Interventions Interventions: Breast feeding basics reviewed;Breast compression;Skin to skin;Adjust position;Breast massage;Support pillows;Hand pump;Hand express;Position options;DEBP;Pre-pump if needed  Lactation Tools Discussed/Used Tools: Pump Breast pump type: Double-Electric  Breast Pump WIC Program: Yes Initiated by:: Peri JeffersonL. Jasmin Winberry RN IBCLC-took to rm.   Consult Status Consult Status: Follow-up Date: 01/17/17 Follow-up type: In-patient    Charyl DancerCARVER, Elohim Brune G 01/17/2017, 6:10 AM

## 2017-01-18 ENCOUNTER — Encounter (HOSPITAL_COMMUNITY): Payer: Self-pay | Admitting: Family Medicine

## 2017-01-18 ENCOUNTER — Other Ambulatory Visit: Payer: Self-pay

## 2017-01-18 LAB — BIRTH TISSUE RECOVERY COLLECTION (PLACENTA DONATION)

## 2017-01-18 NOTE — Discharge Summary (Signed)
OB Discharge Summary     Patient Name: Arvid RightBridget M Cavalieri DOB: 1990/11/06 MRN: [id number]  Date of admission: 01/16/2017 Delivering MD: [name], JACOB J   Date of discharge: 01/19/2017  Admitting diagnosis: 39WKS,WATER BROKE Intrauterine pregnancy: 3680w2d     Secondary diagnosis:  Active Problems:   Indication for care in labor or delivery   Status post cesarean delivery  Additional problems:  Patient Active Problem List   Diagnosis Date Noted  . Indication for care in labor or delivery 01/16/2017  . Status post cesarean delivery 01/16/2017  . Excessive weight gain in pregnancy 10/02/2016  . Nausea and vomiting of pregnancy, antepartum 06/16/2016  . Tobacco abuse 06/15/2016  . Supervision of low-risk pregnancy 06/15/2016  . Obesity in pregnancy 06/15/2016  . Obesity (BMI 35.0-39.9 without comorbidity) 06/15/2016  . Endometriosis 05/24/2016        Discharge diagnosis: Term Pregnancy Delivered                                                                                                Post partum procedures:none  Augmentation: Pitocin  Complications: None  Hospital course:  Onset of Labor With Unplanned C/S  26 y.o. yo G2P1011 at 3080w2d was admitted in Latent Labor on 01/16/2017. Patient had a labor course significant for:  Membrane Rupture Time/Date: 6:00 AM ,01/16/2017   The patient went for cesarean section due to fetal intolerance, and delivered a Viable infant,01/16/2017  Details of operation can be found in separate operative note. Patient had an uncomplicated postpartum course.  She is ambulating,tolerating a regular diet, passing flatus, and urinating well.  Patient is discharged home in stable condition 01/19/17.  Physical exam  Vitals:   01/18/17 0010 01/18/17 0606 01/18/17 1843 01/19/17 0614  BP: 134/62 106/61 126/63 134/75  Pulse: 79 66 78 65  Resp: 18  20 16   Temp: 97.8 F (36.6 C) 98.1 F (36.7 C) 98.4 F (36.9 C) 98.3 F (36.8 C)  TempSrc: Oral  Axillary Oral Oral  SpO2: 100%  100%   Weight:      Height:       General: alert, cooperative and no distress Lochia: appropriate Uterine Fundus: firm Incision: Healing well with no significant drainage, No significant erythema, Dressing is clean, dry, and intact DVT Evaluation: No evidence of DVT seen on physical exam. Labs: Lab Results  Component Value Date   WBC 16.0 (H) 01/17/2017   HGB 11.0 (L) 01/17/2017   HCT 32.6 (L) 01/17/2017   MCV 85.6 01/17/2017   PLT 141 (L) 01/17/2017   CMP Latest Ref Rng & Units 09/13/2014  Glucose 65 - 99 mg/dL 657(Q118(H)  BUN 6 - 20 mg/dL 9  Creatinine 4.690.44 - 6.291.00 mg/dL 5.280.70  Sodium 413135 - 244145 mmol/L 141  Potassium 3.5 - 5.1 mmol/L 4.0  Chloride 101 - 111 mmol/L 108  CO2 22 - 32 mmol/L 26  Calcium 8.9 - 10.3 mg/dL 0.1(U8.8(L)  Total Protein 6.5 - 8.1 g/dL 6.6  Total Bilirubin 0.3 - 1.2 mg/dL 0.3  Alkaline Phos 38 - 126 U/L 45  AST 15 - 41 U/L 18  ALT 14 - 54 U/L 17    Discharge instruction: per After Visit Summary and "Baby and Me Booklet".  After visit meds:  Allergies as of 01/19/2017      Reactions   Bee Venom Hives, Swelling      Medication List    STOP taking these medications   ondansetron 4 MG disintegrating tablet Commonly known as:  ZOFRAN-ODT   ranitidine 150 MG tablet Commonly known as:  ZANTAC     TAKE these medications   oxyCODONE-acetaminophen 5-325 MG tablet Commonly known as:  PERCOCET/ROXICET Take 1 tablet every 4 (four) hours as needed by mouth (pain scale 4-7).   PREPLUS 27-1 MG Tabs   senna-docusate 8.6-50 MG tablet Commonly known as:  Senokot-S Take 2 tablets daily by mouth.       Diet: routine diet  Activity: Advance as tolerated. Pelvic rest for 6 weeks.   Outpatient follow up:4 weeks Follow up Appt: Future Appointments  Date Time Provider Department Center  02/15/2017  1:20 PM Marylene LandKooistra, Kathryn Lorraine, CNM [facility]   Postpartum contraception: Nexplanon  Newborn Data: Live born female   Birth Weight: 5 lb 7.8 oz (2490 g) APGAR: 8, 9  Newborn Delivery   Birth date/time:  01/16/2017 23:02:00 Delivery type:  C-Section, Low Transverse C-section categorization:  Primary     Baby Feeding: Bottle and Breast Disposition:NICU   01/19/2017 Suella BroadKeriann S Minott, MD  OB FELLOW DISCHARGE ATTESTATION  I have seen and examined this patient and agree with above documentation in the resident's note.   Frederik PearJulie P Troye Hiemstra, MD OB Fellow 5:47 PM

## 2017-01-18 NOTE — Lactation Note (Signed)
This note was copied from a baby's chart. Lactation Consultation Note:  Mother reports that she has been going to NICU and trying to feed infant every 2-3 hours. Mother reports that she can see colostrum on infants mouth when she breastfeeds. Mother denies having nipple tenderness when latching. She has latch scores of 7 with last feeding.    Mother pumped yesterday and noted large skin tear on the rt areola. Area is healing but still red and has tiny open area.  Mother placed pump on and #24 flange fits mother. Assist mother with pumping for a few mins. Mother denies discomfort. Advised to use on lowest setting.  Advised mother to pump the left breast and allow area on the rt to heal before starting to pump .  Mother has been using comfort gels and coconut oil. New comfort gels were given to mother and advised to only use one or the other. Suggested to use comfort gels but if coconut oil feels better to use. Attempt to apply sore nipple shells but still rubs the area.   Reviewed hand expression with mother and observed tiny drop of colostrum on the right nipple. Advised mother to hand express frequently  Mother was given Breastfeeding Your NICU baby booklet. Discussed collection , cleaning , storing and transporting exp breast milk. Mother was given colostrum drops and has breastmilk labels.  [facility] [place] [facility] and [facility] and plans to get an electric pump tomorrow on discharge.   Patient Name: Boy Camillia HerterBridget Kean ZOXWR'UToday's Date: 01/18/2017 Reason for consult: Follow-up assessment   Maternal Data    Feeding Feeding Type: Formula Length of feed: 30 min  LATCH Score Latch: Repeated attempts needed to sustain latch, nipple held in mouth throughout feeding, stimulation needed to elicit sucking reflex.  Audible Swallowing: A few with stimulation  Type of Nipple: Everted at rest and after stimulation  Comfort (Breast/Nipple): Soft / non-tender  Hold  (Positioning): Assistance needed to correctly position infant at breast and maintain latch.  LATCH Score: 7  Interventions Interventions: Hand express;Coconut oil;Comfort gels;DEBP  Lactation Tools Discussed/Used     Consult Status Consult Status: Follow-up Date: 01/18/17 Follow-up type: In-patient    Stevan BornKendrick, Ladene Allocca [facility] 01/18/2017, 1:21 PM

## 2017-01-18 NOTE — Clinical Social Work Maternal (Signed)
CLINICAL SOCIAL WORK MATERNAL/CHILD NOTE  Patient Details  Name: Rachel Gregory MRN: [id number] Date of Birth: 08/10/1990  Date:  01/18/2017  Clinical Social Worker Initiating Note:  Lamona Eimer, LCSW Date/Time: Initiated:  01/18/17/1045     Child's Name:  Rachel Gregory   Biological Parents:  Mother, Father(Rachel Gregory)   Need for Interpreter:  None   Reason for Referral:  Behavioral Health Concerns, Other (Comment)(NICU admission)   Address:  [address] [place] [address]    Phone number:  [phone] (home)     Additional phone number:   Household Members/Support Persons (HM/SP):   Household Member/Support Person 1   HM/SP Name Relationship DOB or Age  HM/SP -1   FOB    HM/SP -2        HM/SP -3        HM/SP -4        HM/SP -5        HM/SP -6        HM/SP -7        HM/SP -8          Natural Supports (not living in the home):  Parent, Extended Family, Immediate Family   Professional Supports: None   Employment:     Type of Work:     Education:      Homebound arranged:    Financial Resources:  Medicaid   Other Resources:      Cultural/Religious Considerations Which May Impact Care: None stated.  MOB's facesheet notes religion as [employer].  Strengths:  Ability to meet basic needs , Home prepared for child , Understanding of illness, Compliance with medical plan    Psychotropic Medications:         Pediatrician:       Pediatrician List:   [place]    [place]    [place]    [place]    [place]    [place]      Pediatrician Fax Number:    Risk Factors/Current Problems:  None   Cognitive State:  Able to Concentrate , Alert , Goal Oriented , Insightful    Mood/Affect:  Interested , Calm    CSW Assessment: CSW met with MOB in her first floor room/120 to offer support, introduce services and complete assessment due to hx of Bipolar and baby's admission to NICU for hypoglycemia.  FOB was asleep on the  couch.  MOB was pleasant and receptive of CSW's visit.   MOB talked about her delivery and states she was in labor for 18 hours before needing a c-section.  She describes her experience as "traumatic" as this was not her plan and then her baby was transferred to the NICU.  CSW offered supportive counseling as MOB shared her thoughts and feelings.  She states understanding that baby is in the NICU because it is "what's best for him," but states feeling sad that he is separated from her.  CSW normalized and validated her feelings.  MOB was extremely talkative and seemed to appreciate the time to share her feelings.  CSW encouraged MOB to allow herself to be emotional and to focus on her baby, rather than his surroundings.  She states people have told her to "calm down."  CSW told her that it is okay to cry, but to ask for help if she is unable to regain control of her emotions, or if negative emotions are interfering with daily life or her ability to enjoy this time.  MOB agreed.    She was engaged and attentive to information given by CSW although CSW found it somewhat difficult to finish a sentence before MOB spoke.  She was appropriate in her questions and comments, however.  CSW encouraged her to monitor her emotions and speak with a medical professional if she has concerns at any time.  MOB agreed.  She feels her level of emotion is appropriate to her experience at this point. CSW inquired about dx of Bipolar noted in medical record.  MOB explained that she was given this diagnosis when she was 15 and there was a lot of family conflict.  She states her parents had gotten divorced and her step-father had moved in.  MOB states her mother would not allow her to call her father to leave the house and she told her mother that she would "leave one way or another" and took the rest of her ADHD medication.  She was taken to the psychiatric unit at [facility], had her stomach pumped, and was diagnosed with Bipolar.  She states  she was prescribed Depakote and has not taken the medication in 10 years.  She states no symptoms of mania or depression since this episode.  She feels this was an episode of acting out as a young teenager rather than indication of a psychiatric illness.  She reports no current concerns.   MOB received call from NICU that baby will be getting a feeding tube for anything he does not take by mouth and that RN will help her latch baby while he gets his NG feed once she comes to the NICU.  MOB seemed understanding, but discouraged.  CSW encouraged her to not look at this as a setback, but rather as an intervention to ensure that baby does not have a setback.  MOB continues to report that this experience is "scary and traumatic."  She is appreciative of support offered by CSW and NICU staff.  She seems pleased with care baby is receiving.  CSW provided information about ongoing support services offered and gave contact information, asking MOB to call anytime throughout baby's hospitalization.  Assessment ended as MOB was eager to get to NICU.  CSW Plan/Description:  No Further Intervention Required/No Barriers to Discharge, Other Patient/Family Education, Perinatal Mood and Anxiety Disorder (PMADs) Education, Psychosocial Support and Ongoing Assessment of Needs    Rachel Hachey Elizabeth, LCSW 01/18/2017, 11:18 AM  

## 2017-01-18 NOTE — Progress Notes (Signed)
POSTPARTUM PROGRESS NOTE  Post Partum Day 2 / Post Op Day 2  Subjective:  Rachel Gregory is a 26 y.o. G2P1011 s/p pLTC for fetal intolerance at 6859w2d.  No acute events overnight.  Pt denies problems with ambulating, voiding or po intake.  She denies nausea or vomiting.  Pain is well controlled.  She has not had flatus. She has not had bowel movement.  Lochia Minimal.   Objective: Blood pressure 106/61, pulse 66, temperature 98.1 F (36.7 C), temperature source Axillary, resp. rate 18, height 5' 5.5" (1.664 m), weight 269 lb (122 kg), SpO2 100 %, unknown if currently breastfeeding.  Physical Exam:  General: alert, cooperative and no distress Chest: no respiratory distress Heart:regular rate, distal pulses intact Abdomen: soft, nontender,  Uterine Fundus: firm, appropriately tender DVT Evaluation: No calf swelling or tenderness Extremities: none edema Skin: warm, dry; incision clean/dry/intact with honey comb and pressure dressing. No signs of inflammation.  Recent Labs    01/17/17 0750 01/17/17 0927  HGB 11.3* 11.0*  HCT 34.5* 32.6*    Assessment/Plan: Rachel Gregory is a 26 y.o. G2P1011 s/p pLTC at 2359w2d   PPD#2 - Doing well Contraception: nexplanon Feeding: breast Dispo: Plan for discharge tomorrow.   LOS: 2 days   Lynnae PrudeKeriann S MinottMD 01/18/2017, 8:54 AM

## 2017-01-19 ENCOUNTER — Other Ambulatory Visit: Payer: Self-pay | Admitting: General Practice

## 2017-01-19 ENCOUNTER — Encounter: Payer: Self-pay | Admitting: Advanced Practice Midwife

## 2017-01-19 MED ORDER — OXYCODONE-ACETAMINOPHEN 5-325 MG PO TABS: 1.0000 | ORAL_TABLET | ORAL | 0 refills | Status: DC | PRN

## 2017-01-19 MED ORDER — SENNOSIDES-DOCUSATE SODIUM 8.6-50 MG PO TABS: 2.0000 | ORAL_TABLET | ORAL | 0 refills | Status: DC

## 2017-01-19 MED ORDER — OXYCODONE-ACETAMINOPHEN 5-325 MG PO TABS
1.0000 | ORAL_TABLET | ORAL | 0 refills | Status: DC | PRN
Start: 2017-01-19 — End: 2017-01-19

## 2017-01-19 NOTE — Plan of Care (Signed)
Patient's pain is controlled with meds.

## 2017-01-19 NOTE — Discharge Instructions (Signed)
Home Care Instructions for Mom °ACTIVITY °· Gradually return to your regular activities. °· Let yourself rest. Nap while your baby sleeps. °· Avoid lifting anything that is heavier than 10 lb (4.5 kg) until your health care provider says it is okay. °· Avoid activities that take a lot of effort and energy (are strenuous) until approved by your health care provider. Walking at a slow-to-moderate pace is usually safe. °· If you had a cesarean delivery: °? Do not vacuum, climb stairs, or drive a car for 4-6 weeks. °? Have someone help you at home until you feel like you can do your usual activities yourself. °? Do exercises as told by your health care provider, if this applies. ° °VAGINAL BLEEDING °You may continue to bleed for 4-6 weeks after delivery. Over time, the amount of blood usually decreases and the color of the blood usually gets lighter. However, the flow of bright red blood may increase if you have been too active. If you need to use more than one pad in an hour because your pad gets soaked, or if you pass a large clot: °· Lie down. °· Raise your feet. °· Place a cold compress on your lower abdomen. °· Rest. °· Call your health care provider. ° °If you are breastfeeding, your period should return anytime between 8 weeks after delivery and the time that you stop breastfeeding. If you are not breastfeeding, your period should return 6-8 weeks after delivery. °PERINEAL CARE °The perineal area, or perineum, is the part of your body between your thighs. After delivery, this area needs special care. Follow these instructions as told by your health care provider. °· Take warm tub baths for 15-20 minutes. °· Use medicated pads and pain-relieving sprays and creams as told. °· Do not use tampons or douches until vaginal bleeding has stopped. °· Each time you go to the bathroom: °? Use a peri bottle. °? Change your pad. °? Use towelettes in place of toilet paper until your stitches have healed. °· Do Kegel exercises  every day. Kegel exercises help to maintain the muscles that support the vagina, bladder, and bowels. You can do these exercises while you are standing, sitting, or lying down. To do Kegel exercises: °? Tighten the muscles of your abdomen and the muscles that surround your birth canal. °? Hold for a few seconds. °? Relax. °? Repeat until you have done this 5 times in a row. °· To prevent hemorrhoids from developing or getting worse: °? Drink enough fluid to keep your urine clear or pale yellow. °? Avoid straining when having a bowel movement. °? Take over-the-counter medicines and stool softeners as told by your health care provider. ° °BREAST CARE °· Wear a tight-fitting bra. °· Avoid taking over-the-counter pain medicine for breast discomfort. °· Apply ice to the breasts to help with discomfort as needed: °? Put ice in a plastic bag. °? Place a towel between your skin and the bag. °? Leave the ice on for 20 minutes or as told by your health care provider. ° °NUTRITION °· Eat a well-balanced diet. °· Do not try to lose weight quickly by cutting back on calories. °· Take your prenatal vitamins until your postpartum checkup or until your health care provider tells you to stop. ° °POSTPARTUM DEPRESSION °You may find yourself crying for no apparent reason and unable to cope with all of the changes that come with having a newborn. This mood is called postpartum depression. Postpartum depression happens because your hormone   levels change after delivery. If you have postpartum depression, get support from your partner, friends, and family. If the depression does not go away on its own after several weeks, contact your health care provider. BREAST SELF-EXAM Do a breast self-exam each month, at the same time of the month. If you are breastfeeding, check your breasts just after a feeding, when your breasts are less full. If you are breastfeeding and your period has started, check your breasts on day 5, 6, or 7 of your  period. Report any lumps, bumps, or discharge to your health care provider. Know that breasts are normally lumpy if you are breastfeeding. This is temporary, and it is not a health risk. INTIMACY AND SEXUALITY Avoid sexual activity for at least 3-4 weeks after delivery or until the brownish-red vaginal flow is completely gone. If you want to avoid pregnancy, use some form of birth control. You can get pregnant after delivery, even if you have not had your period. SEEK MEDICAL CARE IF:  You feel unable to cope with the changes that a child brings to your life, and these feelings do not go away after several weeks.  You notice a lump, a bump, or discharge on your breast.  SEEK IMMEDIATE MEDICAL CARE IF:  Blood soaks your pad in 1 hour or less.  You have: ? Severe pain or cramping in your lower abdomen. ? A bad-smelling vaginal discharge. ? A fever that is not controlled by medicine. ? A fever, and an area of your breast is red and sore. ? Pain or redness in your calf. ? Sudden, severe chest pain. ? Shortness of breath. ? Painful or bloody urination. ? Problems with your vision.  You vomit for 12 hours or longer.  You develop a severe headache.  You have serious thoughts about hurting yourself, your child, or anyone else.  This information is not intended to replace advice given to you by your health care provider. Make sure you discuss any questions you have with your health care provider. Document Released: 02/25/2000 Document Revised: 08/05/2015 Document Reviewed: 08/31/2014 Elsevier Interactive Patient Education  2017 [employer] Cesarean Delivery, Care After Refer to this sheet in the next few weeks. These instructions provide you with information about caring for yourself after your procedure. Your health care provider may also give you more specific instructions. Your treatment has been planned according to current medical practices, but problems sometimes occur. Call your  health care provider if you have any problems or questions after your procedure. What can I expect after the procedure? After the procedure, it is common to have:  A small amount of blood or clear fluid coming from the incision.  Some redness, swelling, and pain in your incision area.  Some abdominal pain and soreness.  Vaginal bleeding (lochia).  Pelvic cramps.  Fatigue.  Follow these instructions at home: Incision care   Follow instructions from your health care provider about how to take care of your incision. Make sure you: ? Wash your hands with soap and water before you change your bandage (dressing). If soap and water are not available, use hand sanitizer. ? Change your dressing as told by your health care provider. ? Leave stitches (sutures), skin staples, skin glue, or adhesive strips in place. These skin closures may need to stay in place for 2 weeks or longer. If adhesive strip edges start to loosen and curl up, you may trim the loose edges. Do not remove adhesive strips completely unless your health  care provider tells you to do that.  Check your incision area every day for signs of infection. Check for: ? More redness, swelling, or pain. ? More fluid or blood. ? Warmth. ? Pus or a bad smell.  When you cough or sneeze, hug a pillow. This helps with pain and decreases the chance of your incision opening up (dehiscing). Do this until your incision heals. Medicines  Take over-the-counter and prescription medicines only as told by your health care provider.  If you were prescribed an antibiotic medicine, take it as told by your health care provider. Do not stop taking the antibiotic until it is finished. Driving  Do not drive or operate heavy machinery while taking prescription pain medicine.  Do not drive for 24 hours if you received a sedative. Lifestyle  Do not drink alcohol. This is especially important if you are breastfeeding or taking pain medicine.  Do  not use tobacco products, including cigarettes, chewing tobacco, or e-cigarettes. If you need help quitting, ask your health care provider. Tobacco can delay wound healing. Eating and drinking  Drink at least 8 eight-ounce glasses of water every day unless told not to by your health care provider. If you breastfeed, you may need to drink more water than this.  Eat high-fiber foods every day. These foods may help prevent or relieve constipation. High-fiber foods include: ? Whole grain cereals and breads. ? Brown rice. ? Beans. ? Fresh fruits and vegetables. Activity  Return to your normal activities as told by your health care provider. Ask your health care provider what activities are safe for you.  Rest as much as possible. Try to rest or take a nap while your baby is sleeping.  Do not lift anything that is heavier than your baby or 10 lb (4.5 kg) as told by your health care provider.  Ask your health care provider when you can engage in sexual activity. This may depend on your: ? Risk of infection. ? Healing rate. ? Comfort and desire to engage in sexual activity. Bathing  Do not take baths, swim, or use a hot tub until your health care provider approves. Ask your health care provider if you can take showers. You may only be allowed to take sponge baths until your incision heals.  Keep your dressing dry as told by your health care provider. General instructions  Do not use tampons or douches until your health care provider approves.  Wear: ? Loose, comfortable clothing. ? A supportive and well-fitting bra.  Watch for any blood clots that may pass from your vagina. These may look like clumps of dark red, brown, or black discharge.  Keep your perineum clean and dry as told by your health care provider.  Wipe from front to back when you use the toilet.  If possible, have someone help you care for your baby and help with household activities for a few days after you leave the  hospital.  Keep all follow-up visits for you and your baby as told by your health care provider. This is important. Contact a health care provider if:  You have: ? Bad-smelling vaginal discharge. ? Difficulty urinating. ? Pain when urinating. ? A sudden increase or decrease in the frequency of your bowel movements. ? More redness, swelling, or pain around your incision. ? More fluid or blood coming from your incision. ? Pus or a bad smell coming from your incision. ? A fever. ? A rash. ? Little or no interest in activities you  used to enjoy. ? Questions about caring for yourself or your baby. ? Nausea.  Your incision feels warm to the touch.  Your breasts turn red or become painful or hard.  You feel unusually sad or worried.  You vomit.  You pass large blood clots from your vagina. If you pass a blood clot, save it to show to your health care provider. Do not flush blood clots down the toilet without showing your health care provider.  You urinate more than usual.  You are dizzy or light-headed.  You have not breastfed and have not had a menstrual period for 12 weeks after delivery.  You stopped breastfeeding and have not had a menstrual period for 12 weeks after stopping breastfeeding. Get help right away if:  You have: ? Pain that does not go away or get better with medicine. ? Chest pain. ? Difficulty breathing. ? Blurred vision or spots in your vision. ? Thoughts about hurting yourself or your baby. ? New pain in your abdomen or in one of your legs. ? A severe headache.  You faint.  You bleed from your vagina so much that you fill two sanitary pads in one hour. This information is not intended to replace advice given to you by your health care provider. Make sure you discuss any questions you have with your health care provider. Document Released: 11/19/2001 Document Revised: 07/08/2015 Document Reviewed: 02/01/2015 Elsevier Interactive Patient Education   2017 [employer]

## 2017-01-19 NOTE — Lactation Note (Signed)
Lactation Consultation Note: Mother reports that she has been breastfeeding infant . She has not pumped her breast . Observed that mothers Rt nipple is healing with scabbing . There is a tiny open area on the outer side of the areola. [facility] has an appointment with [facility] [place] today to get an electric pump.  Mother has breastmilk labels and yellow colostrum dots. Mother was advised in transporting ebm. Mother advised to protect her milk supply with consistent pumping every 2-3 hours. Informed mother that she should be hand expressing frequently. Mother aware of available pumping rooms in NICU. Mother is aware of available LC services for breastfeeding questions or concerns.   Patient Name: Rachel RightBridget M Wigal WUJWJ'XToday's Date: 01/19/2017 Reason for consult: Follow-up assessment   Maternal Data    Feeding    LATCH Score                   Interventions    Lactation Tools Discussed/Used     Consult Status Consult Status: Complete    Michel BickersKendrick, Latesa Fratto McCoy 01/19/2017, 10:55 AM

## 2017-01-20 ENCOUNTER — Other Ambulatory Visit: Payer: Self-pay | Admitting: Advanced Practice Midwife

## 2017-01-20 MED ORDER — MUPIROCIN CALCIUM 2 % EX CREA: 1.0000 "application " | TOPICAL_CREAM | CUTANEOUS | 0 refills | Status: DC | PRN

## 2017-01-20 NOTE — Progress Notes (Signed)
[facility] Patient with laceration on nipple at this time. Baby in NICU. Advised pharmacy may be closed, and pt can use OTC lotrimin, neosporin and hydrocortisone in equal parts as a alternative while waiting for medication from pharmacy.  Thressa ShellerHeather Maneh Sieben 2:10 PM 01/20/17

## 2017-01-22 ENCOUNTER — Encounter (HOSPITAL_COMMUNITY): Payer: Self-pay | Admitting: Family Medicine

## 2017-01-22 NOTE — Addendum Note (Signed)
Addendum  created 01/22/17 1423 by Leilani AbleHatchett, Neria Procter, MD   Intraprocedure Event edited, Intraprocedure Staff edited

## 2017-02-14 ENCOUNTER — Encounter: Payer: Self-pay | Admitting: Student

## 2017-02-15 ENCOUNTER — Ambulatory Visit: Payer: Medicaid Other | Admitting: Student

## 2017-03-27 ENCOUNTER — Encounter: Payer: Self-pay | Admitting: Family Medicine

## 2017-03-27 ENCOUNTER — Ambulatory Visit (INDEPENDENT_AMBULATORY_CARE_PROVIDER_SITE_OTHER): Payer: Medicaid Other | Admitting: Family Medicine

## 2017-03-27 DIAGNOSIS — Z30017 Encounter for initial prescription of implantable subdermal contraceptive: Secondary | ICD-10-CM

## 2017-03-27 DIAGNOSIS — Z1389 Encounter for screening for other disorder: Secondary | ICD-10-CM | POA: Diagnosis not present

## 2017-03-27 DIAGNOSIS — Z3049 Encounter for surveillance of other contraceptives: Secondary | ICD-10-CM | POA: Diagnosis not present

## 2017-03-27 DIAGNOSIS — Z975 Presence of (intrauterine) contraceptive device: Secondary | ICD-10-CM | POA: Insufficient documentation

## 2017-03-27 LAB — POCT PREGNANCY, URINE: PREG TEST UR: NEGATIVE

## 2017-03-27 MED ORDER — ETONOGESTREL 68 MG ~~LOC~~ IMPL
68.0000 mg | DRUG_IMPLANT | Freq: Once | SUBCUTANEOUS | Status: AC
  Administered 2017-03-27: 68 mg via SUBCUTANEOUS

## 2017-03-27 NOTE — Progress Notes (Signed)
  Pt states is having shooting pains on c-section.Pt is not interested in talking about Contraceptives.

## 2017-03-27 NOTE — Progress Notes (Signed)
Subjective:     Rachel Gregory is a 27 y.o. female who presents for a postpartum visit. She is 9 weeks postpartum following a low cervical transverse Cesarean section. I have fully reviewed the prenatal and intrapartum course. The delivery was at 39 gestational weeks. Outcome: primary cesarean section, low transverse incision. Anesthesia: spinal. Postpartum course has been uncomplicated. Baby's course complicated by NICU stay of 5-days, but now doing well. Baby is feeding by bottle - Similac Neosure. Bleeding moderate lochia. Bowel function is normal. Bladder function is normal. Patient is sexually active, last sexual intercourse of over 2 weeks ago. She was thinking about Nexplanon for contraception, but has been worried about side effects she read online.. Postpartum depression screening: negative.  The following portions of the patient's history were reviewed and updated as appropriate: allergies, current medications, past family history, past medical history, past social history, past surgical history and problem list.  Review of Systems Pertinent items noted in HPI and remainder of comprehensive ROS otherwise negative.   Objective:    BP 124/65   Pulse 65   Ht 5\' 6"  (1.676 m)   Wt 256 lb 12.8 oz (116.5 kg)   LMP 03/02/2017 (Approximate)   Breastfeeding? No   BMI 41.45 kg/m   General:  alert, cooperative and no distress   HEENT:  [place], AT, normal oral mucosa  Lungs: normal respiratory effort  Heart:  Normal rate  Abdomen: Soft, NT, ND   Skin:  warm, dry; LTCS incision is well-healed w/o erythrema, drainage or odor  MSK: No edema  Psych:  Normal mood and affect  Neuro: Alert and oritented x3        Results for orders placed or performed in visit on 03/27/17 (from the past 24 hour(s))  Pregnancy, urine POC   Collection Time: 03/27/17  4:49 PM  Result Value Ref Range   Preg Test, Ur NEGATIVE NEGATIVE    Assessment:    - Normal postpartum exam.  - Last pap smear 2018, normal -  Contraception: after discussing r/b/a, pt decided to get Nexplanon. Negative UPT today  Plan:    1. Contraception: Nexplanon - placed today. See procedure note. 2. Routine preventive care recommended 3. Follow up in: 1 year or as needed.     Procedure Note   Procedure Note: Nexplanon insertion  Patient is a 27 y.o. G2P1011 now 772-months postaprtum from LTCS. She desires long-term reversible contraception.  Risks/benefits/side effects of Nexplanon have been discussed with patient and her questions have been answered. Patient is aware of the common side effect of irregular bleeding, which the incidence of decreases over time.  She is right-handed, so her left arm, approximately 10 cm proximal from the elbow, was cleansed with alcohol and anesthetized with 2 mL of 1% Lidocaine.  The area was cleansed again with betadine and the Nexplanon was inserted per manufacturer's recommendations without difficulty.  A steri-strip and pressure bandage were applied.  Pt was instructed to keep the area clean and dry, remove pressure bandage in 24 hours, and keep insertion site covered with the steri-strip for 3-5 days.  She was given a card indicating date Nexplanon was inserted and date it needs to be removed. Follow-up PRN problems.  Kandra NicolasJulie P Alora Gorey  03/27/2017 5:37 PM

## 2017-04-03 ENCOUNTER — Encounter: Payer: Self-pay | Admitting: *Deleted

## 2017-07-11 IMAGING — US US MFM OB DETAIL+14 WK
1 series · 14 of 28 positions shown · non-contrast
Comparison: none

[Series 1: us mfm ob detail+14 wk · 14 of 79 slices shown]
[im 3/79]
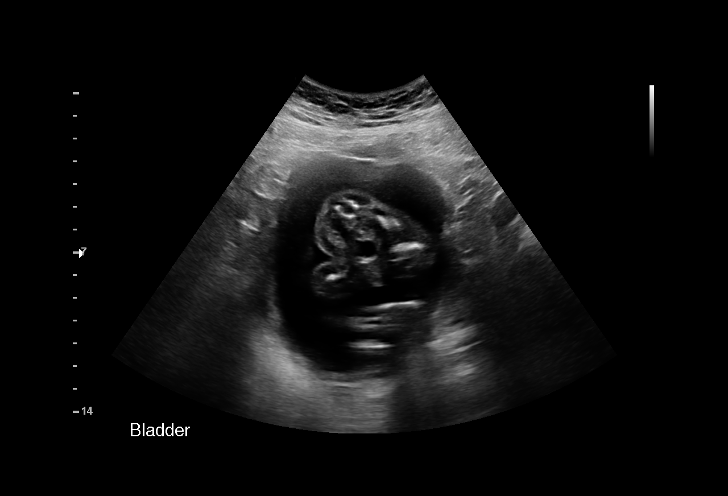
[im 9/79]
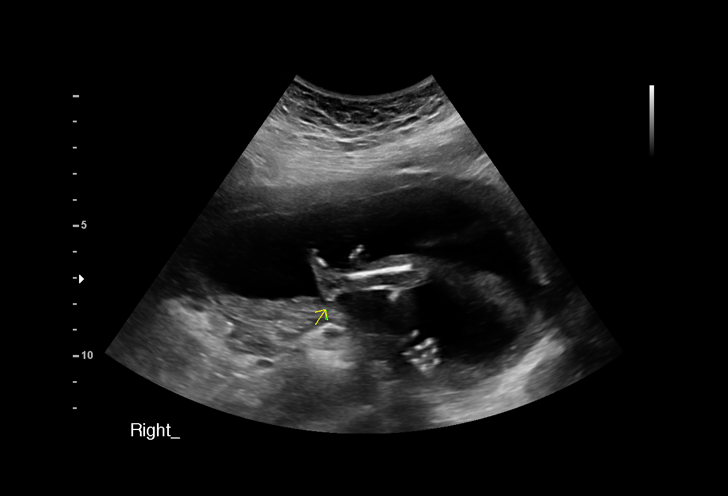
[im 15/79]
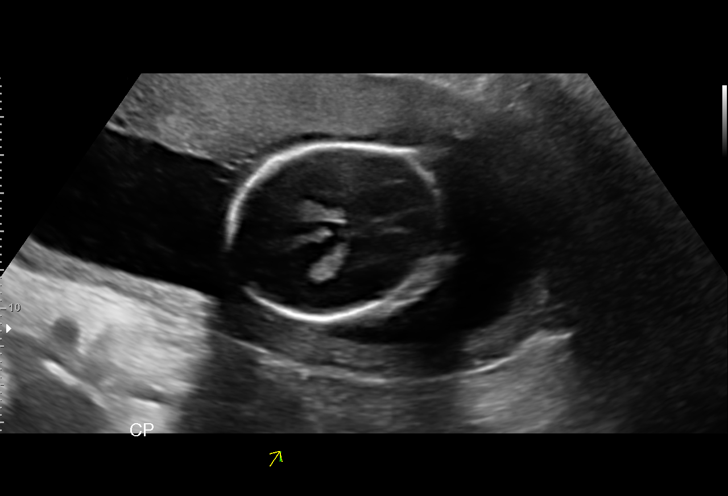
[im 21/79]
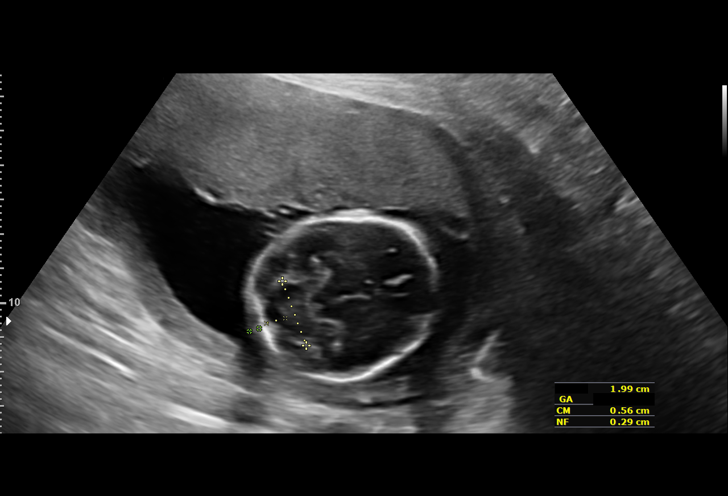
[im 27/79]
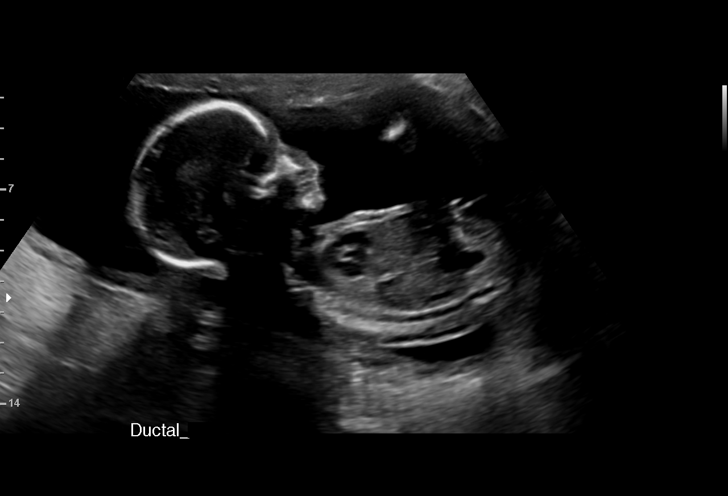
[im 32/79]
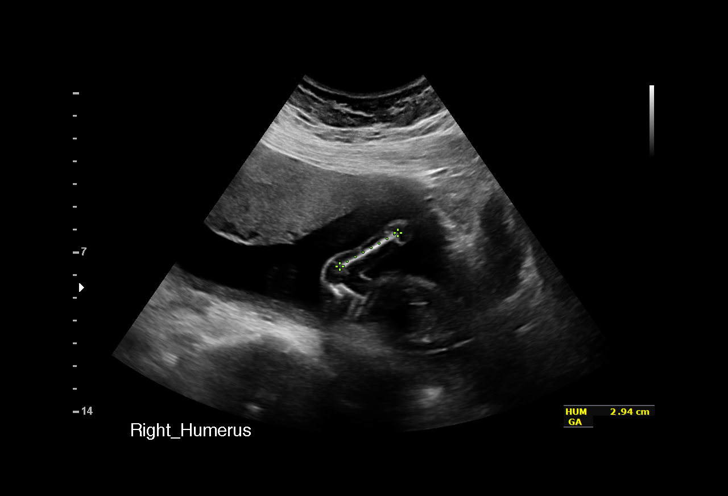
[im 38/79]
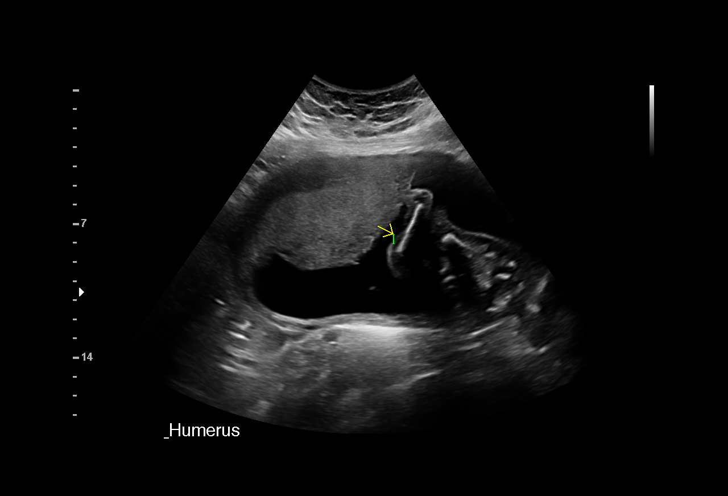
[im 44/79]
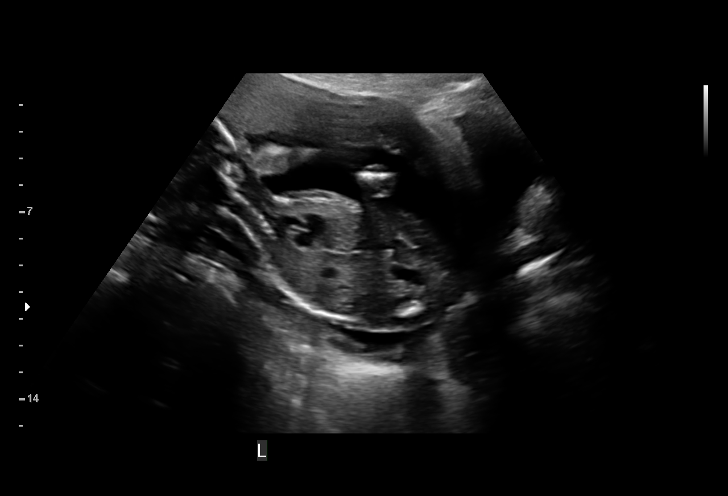
[im 50/79]
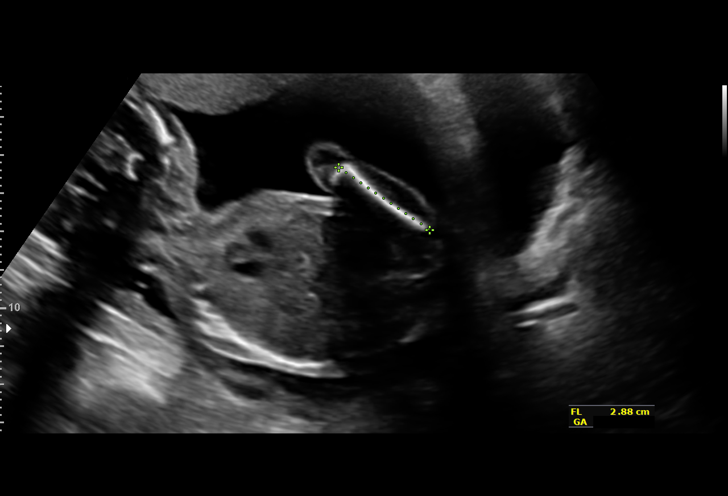
[im 55/79]
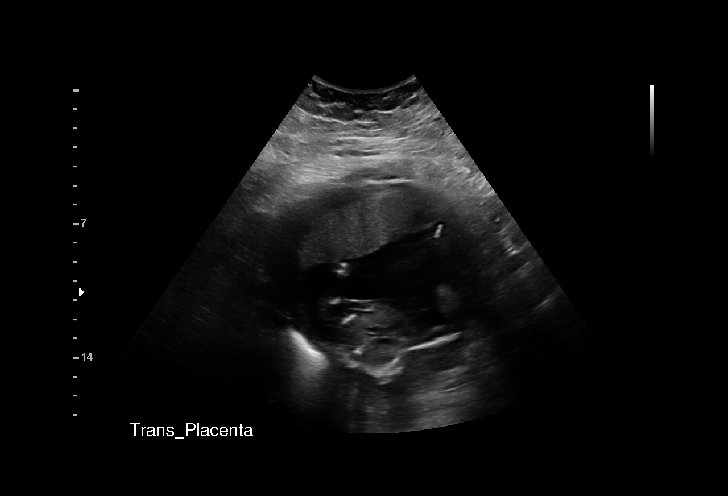
[im 61/79]
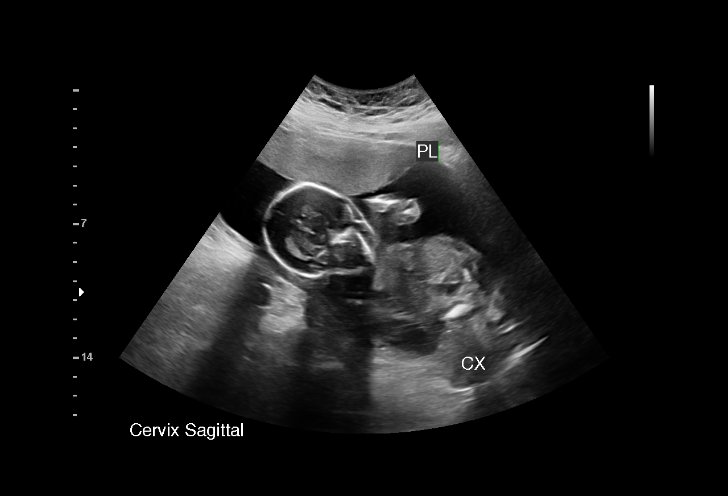
[im 67/79]
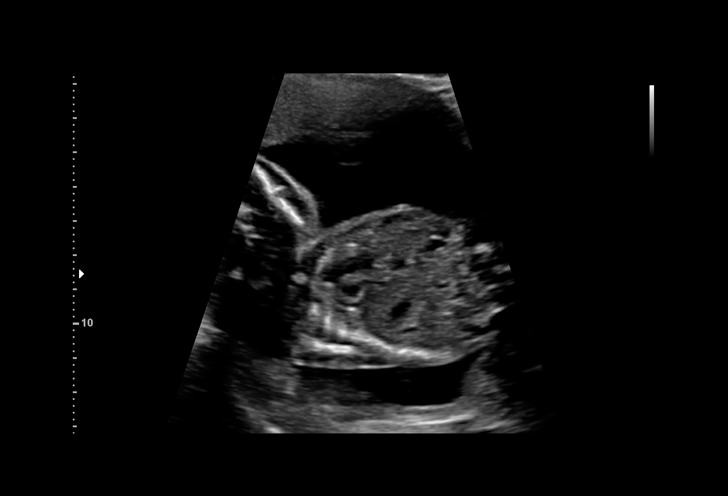
[im 73/79]
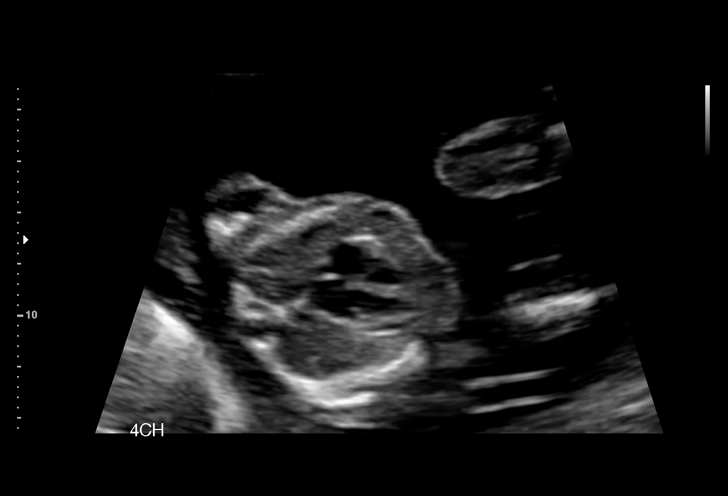
[im 79/79]
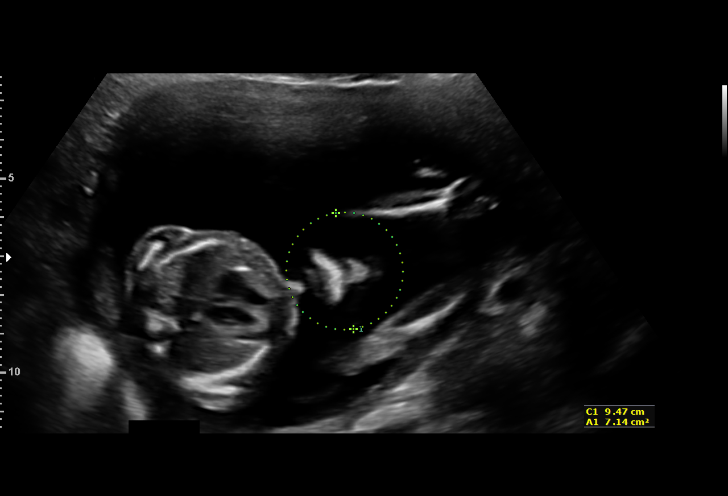

[14 of 28 positions shown; findings below may reference images not displayed]

OB/Gyn Clinic
[REDACTED]

1  PAULUS N CEEJAY          [id number]      [id number]     [id number]
Indications

19 weeks gestation of pregnancy
Obesity complicating pregnancy, second
trimester
Tobacco use complicating pregnancy,
second trimester
Encounter for antenatal screening for
malformations
OB History

Blood Type:            Height:  5'6"   Weight (lb):  247       BMI:
Gravidity:    2         Term:   0        Prem:   0        SAB:   1
TOP:          0       Ectopic:  0        Living: 0
Fetal Evaluation

Num Of Fetuses:     1
Fetal Heart         153
Rate(bpm):
Cardiac Activity:   Observed
Presentation:       Breech
Placenta:           Anterior, above cervical os
P. Cord Insertion:  Not well visualized
Amniotic Fluid
AFI FV:      Subjectively within normal limits

Largest Pocket(cm)
5.95
Biometry

BPD:        46  mm     G. Age:  19w 6d         81  %    CI:        78.69   %    70 - 86
FL/HC:      17.6   %    16.1 -
HC:       164   mm     G. Age:  19w 1d         43  %    HC/AC:      1.15        1.09 -
AC:      142.7  mm     G. Age:  19w 4d         61  %    FL/BPD:     62.8   %
FL:       28.9  mm     G. Age:  18w 6d         34  %    FL/AC:      20.3   %    20 - 24
HUM:      29.3  mm     G. Age:  19w 4d         61  %
CER:      19.9  mm     G. Age:  19w 0d         46  %
NFT:       2.9  mm

CM:        5.6  mm

Est. FW:     284  gm    0 lb 10 oz      48  %
Gestational Age

LMP:           21w 2d        Date:  04/01/16                 EDD:   01/06/17
U/S Today:     19w 3d                                        EDD:   01/19/17
Best:          19w 1d     Det. By:  Early Ultrasound         EDD:   01/21/17
(06/15/16)
Anatomy

Cranium:               Appears normal         Aortic Arch:            Appears normal
Cavum:                 Not well visualized    Ductal Arch:            Appears normal
Ventricles:            Appears normal         Diaphragm:              Appears normal
Choroid Plexus:        Appears normal         Stomach:                Appears normal, left
sided
Cerebellum:            Appears normal         Abdomen:                Appears normal
Posterior Fossa:       Appears normal         Abdominal Wall:         Not well visualized
Nuchal Fold:           Appears normal         Cord Vessels:           Appears normal (3
vessel cord)
Face:                  Appears normal         Kidneys:                Appear normal
(orbits and profile)
Lips:                  Appears normal         Bladder:                Appears normal
Thoracic:              Appears normal         Spine:                  Not well visualized
Heart:                 Not well visualized    Upper Extremities:      Appears normal;
hands nsw
RVOT:                  Not well visualized    Lower Extremities:      Appears normal; lt
heel nsw
LVOT:                  Not well visualized

Other:  Fetus appears to be a male. Rt Heel visualized. Technically difficult
due to maternal habitus and fetal position.
Cervix Uterus Adnexa

Cervix
Length:           3.58  cm.
Normal appearance by transabdominal scan.

Uterus
No abnormality visualized.
Left Ovary
Within normal limits.

Right Ovary
Within normal limits.
Impression

Single IUP at 19w 1d
Maternal obesity
Limited views of the fetal heart, abdominal wall and spine
obtained
The remainder of the fetal anatomy appears normal
Anterior placenta without previa
Normal amniotic fluid volume
Recommendations

Recommend follow-up ultrasound examination in 4-6 weeks
to complete anatomy

## 2017-08-23 IMAGING — US US MFM OB FOLLOW-UP
1 series · 13 of 28 positions shown · non-contrast
Comparison: none

OB/Gyn Clinic
[REDACTED]

1  BLANDINE JUSINO          [id number]      [id number]     [id number]
Indications
25 weeks gestation of pregnancy
Obesity complicating pregnancy, second
trimester
Tobacco use complicating pregnancy,
second trimester
Evaluate anatomy not seen on prior
sonogram
OB History
Blood Type:            Height:  5'6"   Weight (lb):  247      BMI:
Gravidity:    2         Term:   0        Prem:   0        SAB:   1
TOP:          0       Ectopic:  0        Living: 0
Fetal Evaluation
Num Of Fetuses:     1
Fetal Heart         155
Rate(bpm):
Cardiac Activity:   Observed
Presentation:       Frank breech
Placenta:           Anterior, above cervical os
P. Cord Insertion:  Visualized
Amniotic Fluid
AFI FV:      Subjectively within normal limits
Largest Pocket(cm)
7.28
Biometry
BPD:      68.6  mm     G. Age:  27w 4d         97  %    CI:        76.44   %   70 - 86
FL/HC:      18.5   %   18.7 -
HC:      248.6  mm     G. Age:  27w 0d         84  %    HC/AC:      1.18       1.04 -
AC:      210.4  mm     G. Age:  25w 4d         50  %    FL/BPD:     67.1   %   71 - 87
FL:         46  mm     G. Age:  25w 2d         36  %    FL/AC:      21.9   %   20 - 24
HUM:      42.4  mm     G. Age:  25w 3d         48  %
Est. FW:     843  gm    1 lb 14 oz      61  %
Gestational Age
LMP:           27w 3d       Date:   04/01/16                 EDD:   01/06/17
U/S Today:     26w 3d                                        EDD:   01/13/17
Best:          25w 2d    Det. By:   Early Ultrasound         EDD:   01/21/17
(06/15/16)
Anatomy
Cranium:               Appears normal         Aortic Arch:            Appears normal
Cavum:                 limited views          Ductal Arch:            Previously seen
appear normal
Ventricles:            Appears normal         Diaphragm:              Appears normal
Choroid Plexus:        Appears normal         Stomach:                Appears normal, left
sided
Cerebellum:            Appears normal         Abdomen:                Appears normal
Posterior Fossa:       Appears normal         Abdominal Wall:         limited views
appear noramal
Nuchal Fold:           Not applicable (>20    Cord Vessels:           Previously seen
wks GA)
Face:                  Profile nl; orbits     Kidneys:                Appear normal
prev visualized
Lips:                  Previously seen        Bladder:                Appears normal
Thoracic:              Appears normal         Spine:                  Appears normal
Heart:                 Appears normal         Upper Extremities:      Previously seen;
(4CH, axis, and situs                          hands nws
RVOT:                  Appears normal         Lower Extremities:      Previously seen;
ankles appear
normal
LVOT:                  Appears normal
Other:  Male gender previously seen. Heels visualized. Technically difficult
due to maternal habitus and fetal position.
Cervix Uterus Adnexa
Cervix
Length:           3.74  cm.
Normal appearance by transabdominal scan.
Uterus
No abnormality visualized.
Left Ovary
Not visualized.
Right Ovary
Cul De Sac:   No free fluid seen.
Adnexa:       No abnormality visualized.
Impression
INDICATION: 25 yr old [id number] at 00w0d for follow up
ultrasound to reevaluate fetal anatomy. Remote read.

[Series 1: us mfm ob follow-up · 56 acquisitions, 13 frames shown]
[im 3/56]
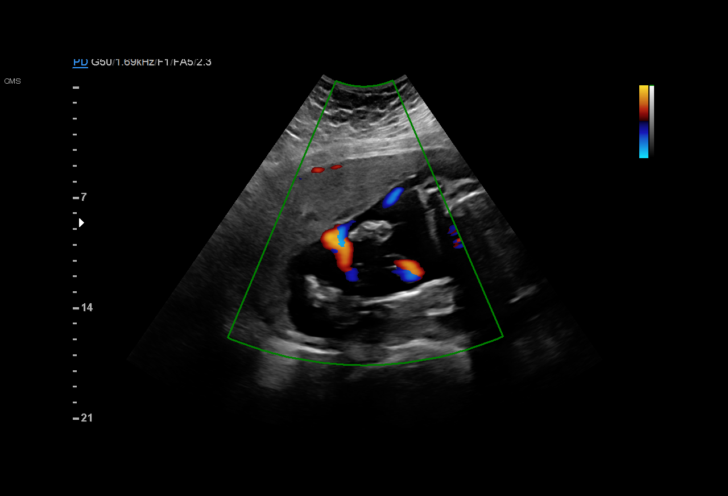
[im 7/56]
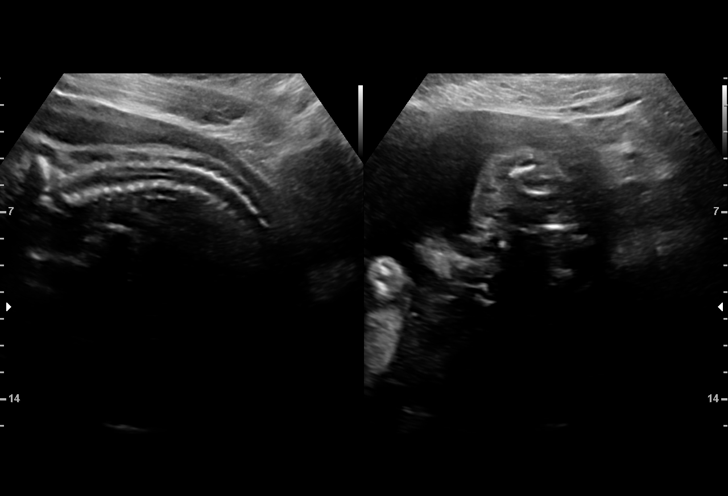
[im 11/56]
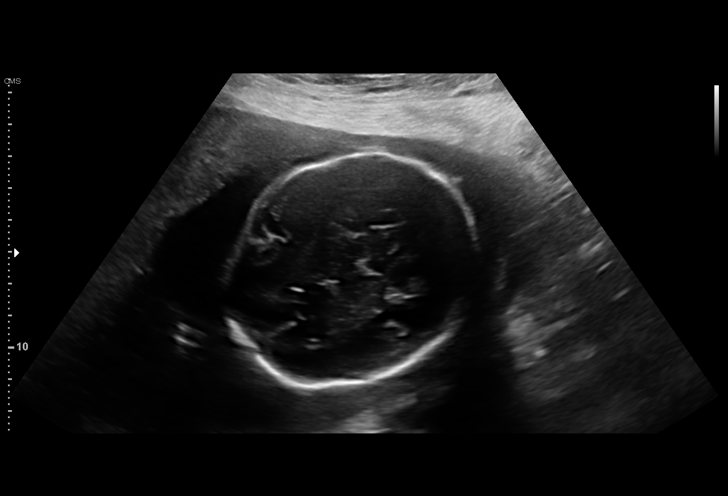
[im 15/56]
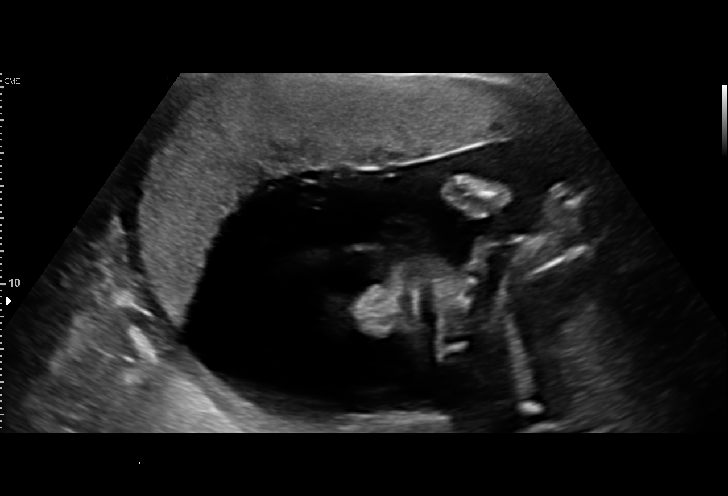
[im 19/56]
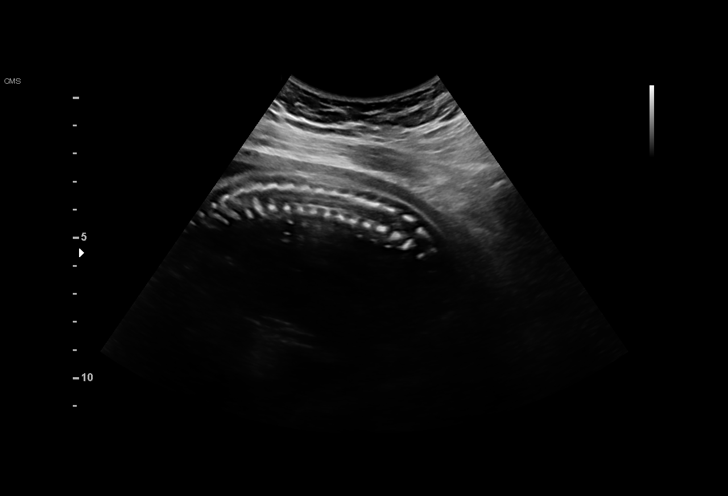
[im 23/56]
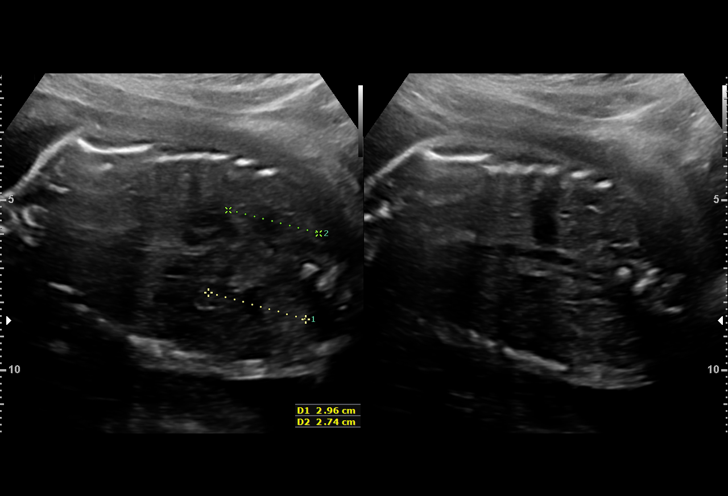
[im 29/56]
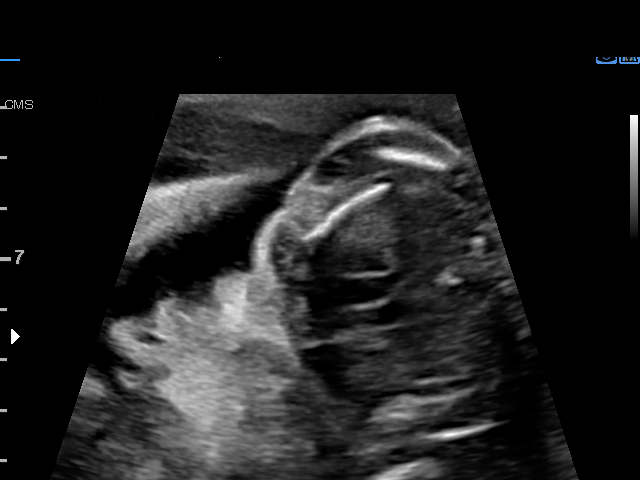
[im 33/56]
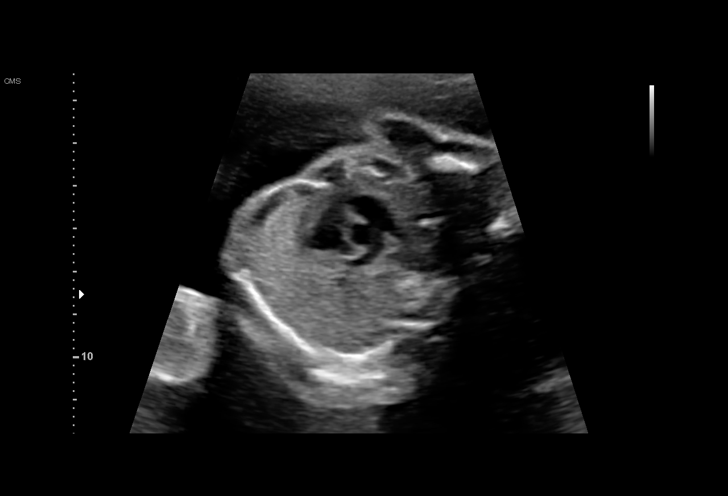
[im 37/56]
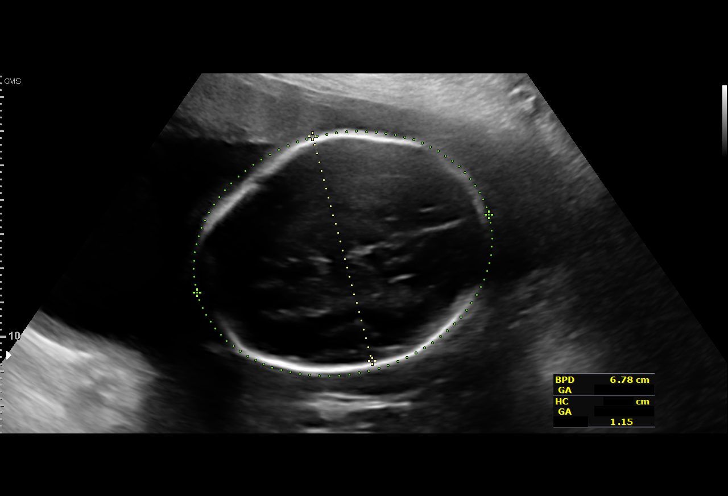
[im 41/56]
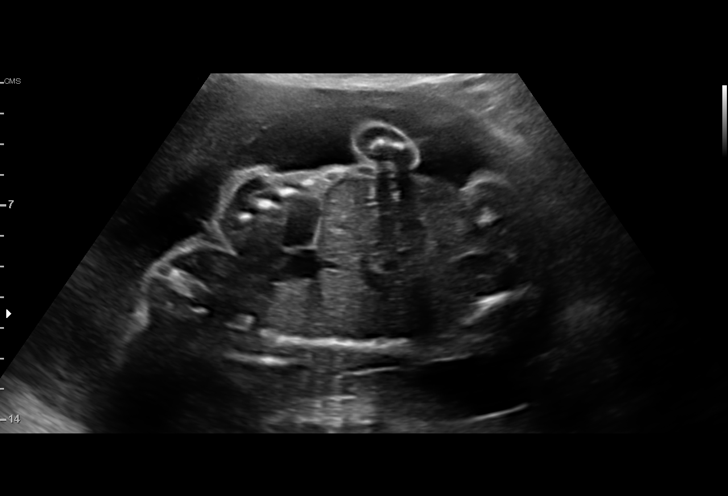
[im 45/56]
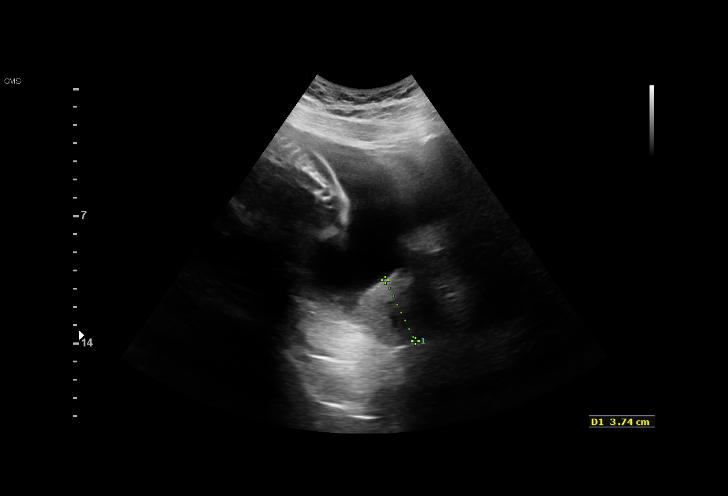
[im 49/56]
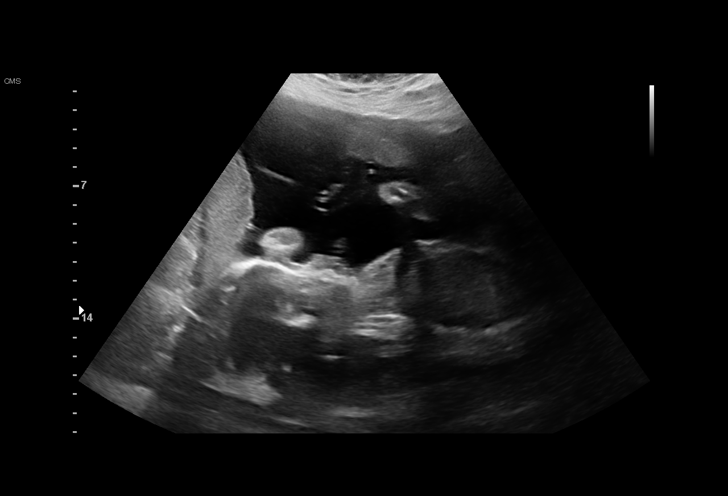
[im 53/56]
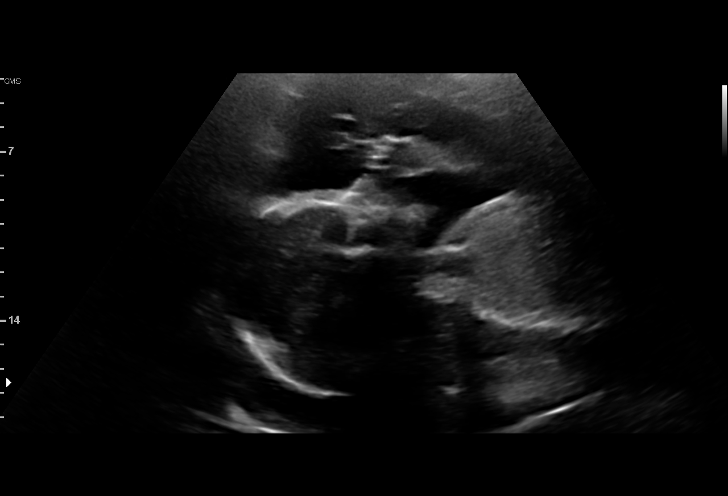

[13 of 28 positions shown; findings below may reference images not displayed]

FINDINGS: 1. Single intrauterine pregnancy.
2. Estimated fetal weight is in the 61st%.
3. Anterior placenta without evidence of previa.
4. Normal amniotic fluid volume.
5. Normal transabdominal cervical length.
6. The hands are not well visualized.
7. The remainder of the limited anatomy survey is normal;
any anatomy not evaluated on today's exam was evaluated
on the previous exam. The views of the cavum and
abdominal wall are somewhat limited- no abnormalities seen
on limited views.
Recommendations

1. Appropriate fetal growth.
2. Limited anatomy survey:
- anatomy survey remains limited after 2 attempts due to
maternal body habitus and fetal position
- if desired may follow up in 4 weeks to reevaluate anatomy
3. Normal first trimester screen

## 2017-09-24 ENCOUNTER — Encounter: Payer: Self-pay | Admitting: Family Medicine

## 2017-11-22 ENCOUNTER — Ambulatory Visit: Payer: Medicaid Other | Admitting: Family Medicine

## 2018-11-04 ENCOUNTER — Ambulatory Visit (LOCAL_COMMUNITY_HEALTH_CENTER): Payer: Medicaid Other | Admitting: Nurse Practitioner

## 2018-11-04 ENCOUNTER — Encounter: Payer: Self-pay | Admitting: Nurse Practitioner

## 2018-11-04 ENCOUNTER — Other Ambulatory Visit: Payer: Self-pay

## 2018-11-04 VITALS — BP 108/69 | Ht 66.0 in | Wt 244.8 lb

## 2018-11-04 DIAGNOSIS — Z3009 Encounter for other general counseling and advice on contraception: Secondary | ICD-10-CM | POA: Diagnosis not present

## 2018-11-04 DIAGNOSIS — Z3046 Encounter for surveillance of implantable subdermal contraceptive: Secondary | ICD-10-CM | POA: Diagnosis not present

## 2018-11-04 DIAGNOSIS — Z30011 Encounter for initial prescription of contraceptive pills: Secondary | ICD-10-CM

## 2018-11-04 MED ORDER — NORETHINDRONE 0.35 MG PO TABS: 1.0000 | ORAL_TABLET | Freq: Every day | ORAL | 2 refills | Status: DC

## 2018-11-04 NOTE — Progress Notes (Signed)
Pt has not had a physical at [facility]. Last sex was about 3 weeks ago. Wants nexplanon removed today.

## 2018-11-04 NOTE — Progress Notes (Signed)
Pt posted by provider (Rx for OCP due to insurance).

## 2018-11-04 NOTE — Progress Notes (Signed)
[facility] problem visit - desires Nexplanon removal and to begin OCP's for [facility] [place] [facility]  Subjective:  Rachel Gregory is a 28 y.o. being seen today for - Nexplanon removal and to begin OCP's for [facility]  Chief Complaint  Patient presents with  . Contraception    here for nexplanon removal, wants to discuss OCP    Client desires Nexplanon removal Denies any hx of blood clots or migraine headaches  Bleeding and headaches with Nexplanon Admits to taking  - Micronor previously Hx of - Endometriosis and asthma Has "Emergency inhaler" - denies recent use  Admits to smoking - 1/2 ppd - thinking about quitting   Does the patient have a current or past history of drug use? No   No components found for: HCV]   There are no preventive care reminders to display for this patient.  Review of Systems  All other systems reviewed and are negative.   The following portions of the patient's history were reviewed and updated as appropriate: allergies, current medications, past family history, past medical history, past social history, past surgical history and problem list. Problem list updated.   See flowsheet for other program required questions.  Objective:   Vitals:   11/04/18 1516  BP: 108/69  Weight: 244 lb 12.8 oz (111 kg)  Height: 5\' 6"  (1.676 m)    Physical Exam Vitals signs reviewed.  Constitutional:      Appearance: Normal appearance. She is well-developed and normal weight.  Pulmonary:     Effort: Pulmonary effort is normal.  Skin:    General: Skin is warm and dry.  Neurological:     Mental Status: She is alert.  Psychiatric:        Behavior: Behavior is cooperative.       Assessment and Plan:  Rachel Gregory is a 28 y.o. female presenting to the [facility] Department for a Women's Health problem visit  Patient with current LARC device complaining of irregular bleeding. Has a Nexplanon. Counseled on  options which include watchful waiting, scheduled NSAIDs, OCP for limited time, removal. Counseled on the the normality of bleeding with method and typical bleeding patterns with LARC method.   1. Encounter for Nexplanon removal  Nexplanon Removal Patient identified, informed consent performed, consent signed.   Appropriate time out taken. Nexplanon site identified.  Area prepped in usual sterile fashon. 3 ml of 1% lidocaine with Epinephrine was used to anesthetize the area at the distal end of the implant and along implant site. A small stab incision was made right beside the implant on the distal portion.  The Nexplanon rod was grasped using straight hemostats/manual and removed without difficulty.  There was minimal blood loss. There were no complications.  Steri-strips were applied over the small incision.  A pressure bandage was applied to reduce any bruising.  The patient tolerated the procedure well and was given post procedure instructions.    Nexplanon:   Counseled patient to take OTC analgesic starting as soon as lidocaine starts to wear off and take regularly for at least 48 hr to decrease discomfort.  Specifically to take with food or milk to decrease stomach upset and for IB 600 mg (3 tablets) every 6 hrs; IB 800 mg (4 tablets) every 8 hrs; or Aleve 2 tablets every 12 hrs.    2. Family planning, BCP (birth control pills) initial prescription Discussed with client how to properly take OCP's - discussed what to do  if pills are missed - advised to use condoms consistently x 2 wks as back up method while starting OCP's.   - norethindrone (MICRONOR) 0.35 MG tablet; Take 1 tablet (0.35 mg total) by mouth daily.  Dispense: 1 Package; Refill: 2   Client verbalizes understanding and is in agreement with plan of care   Return in about 3 months (around 02/04/2019) for Continue OCP's for BCM, Yearly physical exam.  No future appointments.  Donn PieriniKarla W Lindee Leason, NP

## 2018-11-06 ENCOUNTER — Encounter: Payer: Self-pay | Admitting: Nurse Practitioner

## 2019-02-03 ENCOUNTER — Encounter: Payer: Self-pay | Admitting: Physician Assistant

## 2019-02-03 NOTE — Progress Notes (Signed)
Received faxed refill request for patient for Norlyda OC.  Per review of chart, patient seen on 11/04/2018, for Nexplanon removal and started on Micronor.  At that visit, patient told to RTC for PE to get more OCs.  Form completed and faxed to [facility] to allow patient 3 more refills.  Will need to RTC for PE/RP prior to more refills.

## 2019-03-13 ENCOUNTER — Emergency Department: Payer: Self-pay

## 2019-03-13 ENCOUNTER — Emergency Department
Admission: EM | Admit: 2019-03-13 | Discharge: 2019-03-13 | Disposition: A | Discharge status: Home / Self Care | Payer: Self-pay | Attending: Emergency Medicine | Admitting: Emergency Medicine

## 2019-03-13 ENCOUNTER — Other Ambulatory Visit: Payer: Self-pay

## 2019-03-13 DIAGNOSIS — M545 Low back pain, unspecified: Secondary | ICD-10-CM

## 2019-03-13 DIAGNOSIS — F909 Attention-deficit hyperactivity disorder, unspecified type: Secondary | ICD-10-CM | POA: Insufficient documentation

## 2019-03-13 DIAGNOSIS — Z79899 Other long term (current) drug therapy: Secondary | ICD-10-CM | POA: Insufficient documentation

## 2019-03-13 DIAGNOSIS — J45909 Unspecified asthma, uncomplicated: Secondary | ICD-10-CM | POA: Insufficient documentation

## 2019-03-13 DIAGNOSIS — F17210 Nicotine dependence, cigarettes, uncomplicated: Secondary | ICD-10-CM | POA: Insufficient documentation

## 2019-03-13 LAB — URINALYSIS, COMPLETE (UACMP) WITH MICROSCOPIC
Bacteria, UA: NONE SEEN
Bilirubin Urine: NEGATIVE
Glucose, UA: NEGATIVE mg/dL
Hgb urine dipstick: NEGATIVE
Ketones, ur: NEGATIVE mg/dL
Leukocytes,Ua: NEGATIVE
Nitrite: NEGATIVE
Protein, ur: NEGATIVE mg/dL
Specific Gravity, Urine: 1.026 (ref 1.005–1.030)
pH: 5 (ref 5.0–8.0)

## 2019-03-13 LAB — POCT PREGNANCY, URINE: Preg Test, Ur: NEGATIVE

## 2019-03-13 MED ORDER — KETOROLAC TROMETHAMINE 30 MG/ML IJ SOLN
30.0000 mg | Freq: Once | INTRAMUSCULAR | Status: AC
  Administered 2019-03-13: 17:00:00 30 mg via INTRAMUSCULAR
  Filled 2019-03-13: qty 1

## 2019-03-13 MED ORDER — OXYCODONE-ACETAMINOPHEN 5-325 MG PO TABS
1.0000 | ORAL_TABLET | Freq: Once | ORAL | Status: AC
  Administered 2019-03-13: 1 via ORAL
  Filled 2019-03-13: qty 1

## 2019-03-13 MED ORDER — KETOROLAC TROMETHAMINE 10 MG PO TABS: 10.0000 mg | ORAL_TABLET | Freq: Four times a day (QID) | ORAL | 0 refills | Status: DC | PRN

## 2019-03-13 MED ORDER — LIDOCAINE 5 % EX PTCH: 1.0000 | MEDICATED_PATCH | CUTANEOUS | 0 refills | Status: DC

## 2019-03-13 MED ORDER — ORPHENADRINE CITRATE 30 MG/ML IJ SOLN
60.0000 mg | Freq: Two times a day (BID) | INTRAMUSCULAR | Status: DC
  Administered 2019-03-13: 17:00:00 60 mg via INTRAMUSCULAR
  Filled 2019-03-13: qty 2

## 2019-03-13 MED ORDER — LIDOCAINE 5 % EX PTCH
1.0000 | MEDICATED_PATCH | CUTANEOUS | Status: DC
  Administered 2019-03-13: 17:00:00 1 via TRANSDERMAL
  Filled 2019-03-13: qty 1

## 2019-03-13 MED ORDER — CYCLOBENZAPRINE HCL 5 MG PO TABS: ORAL_TABLET | ORAL | 0 refills | Status: DC

## 2019-03-13 NOTE — Discharge Instructions (Signed)
Your x-ray was normal.  There is no signs of a kidney stone or an infection on your urinalysis.  Please take toradol for inflammation and flexeril to help relax your muscles. You can still take tylenol but do not take any additional ibuprofen. Continue using heat. Please follow up with primary care next week.

## 2019-03-13 NOTE — ED Triage Notes (Signed)
Pt states back pain since Monday. Denies urinary symptoms. States has tried heat and OTC meds. A&O, ambulatory. States movement makes pain worse.

## 2019-03-13 NOTE — ED Notes (Signed)
See triage note  Presents with lower back pain for couple of days  States pain is mainly to left and is non radiating  Ambulates well  Has had some nausea   Low grade fever ambulates well
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
## 2019-09-04 ENCOUNTER — Encounter: Payer: Self-pay | Admitting: Emergency Medicine

## 2019-09-04 ENCOUNTER — Emergency Department
Admission: EM | Admit: 2019-09-04 | Discharge: 2019-09-04 | Disposition: A | Discharge status: Home / Self Care | Payer: Medicaid Other | Attending: Student | Admitting: Student

## 2019-09-04 ENCOUNTER — Other Ambulatory Visit: Payer: Self-pay

## 2019-09-04 DIAGNOSIS — F17210 Nicotine dependence, cigarettes, uncomplicated: Secondary | ICD-10-CM | POA: Insufficient documentation

## 2019-09-04 DIAGNOSIS — J45909 Unspecified asthma, uncomplicated: Secondary | ICD-10-CM | POA: Insufficient documentation

## 2019-09-04 DIAGNOSIS — M545 Low back pain, unspecified: Secondary | ICD-10-CM

## 2019-09-04 DIAGNOSIS — R109 Unspecified abdominal pain: Secondary | ICD-10-CM | POA: Insufficient documentation

## 2019-09-04 LAB — COMPREHENSIVE METABOLIC PANEL
ALT: 19 U/L (ref 0–44)
AST: 17 U/L (ref 15–41)
Albumin: 4.1 g/dL (ref 3.5–5.0)
Alkaline Phosphatase: 51 U/L (ref 38–126)
Anion gap: 7 (ref 5–15)
BUN: 8 mg/dL (ref 6–20)
CO2: 26 mmol/L (ref 22–32)
Calcium: 9.3 mg/dL (ref 8.9–10.3)
Chloride: 104 mmol/L (ref 98–111)
Creatinine, Ser: 0.75 mg/dL (ref 0.44–1.00)
GFR calc Af Amer: >60 mL/min (ref >60)
GFR calc non Af Amer: >60 mL/min (ref >60)
Glucose, Bld: 151 mg/dL — ABNORMAL HIGH (ref 70–99)
Potassium: 4.5 mmol/L (ref 3.5–5.1)
Sodium: 137 mmol/L (ref 135–145)
Total Bilirubin: 0.6 mg/dL (ref 0.3–1.2)
Total Protein: 7.5 g/dL (ref 6.5–8.1)

## 2019-09-04 LAB — URINALYSIS, COMPLETE (UACMP) WITH MICROSCOPIC
Bacteria, UA: NONE SEEN
Bilirubin Urine: NEGATIVE
Glucose, UA: NEGATIVE mg/dL
Hgb urine dipstick: NEGATIVE
Ketones, ur: NEGATIVE mg/dL
Leukocytes,Ua: NEGATIVE
Nitrite: NEGATIVE
Protein, ur: NEGATIVE mg/dL
Specific Gravity, Urine: 1.011 (ref 1.005–1.030)
pH: 7 (ref 5.0–8.0)

## 2019-09-04 LAB — CBC
HCT: 41.8 % (ref 36.0–46.0)
Hemoglobin: 12.9 g/dL (ref 12.0–15.0)
MCH: 24.5 pg — ABNORMAL LOW (ref 26.0–34.0)
MCHC: 30.9 g/dL (ref 30.0–36.0)
MCV: 79.5 fL — ABNORMAL LOW (ref 80.0–100.0)
Platelets: 185 10*3/uL (ref 150–400)
RBC: 5.26 MIL/uL — ABNORMAL HIGH (ref 3.87–5.11)
RDW: 15.7 % — ABNORMAL HIGH (ref 11.5–15.5)
WBC: 6.9 10*3/uL (ref 4.0–10.5)
nRBC: 0 % (ref 0.0–0.2)

## 2019-09-04 LAB — POCT PREGNANCY, URINE: Preg Test, Ur: NEGATIVE

## 2019-09-04 LAB — LIPASE, BLOOD: Lipase: 25 U/L (ref 11–51)

## 2019-09-04 MED ORDER — METHOCARBAMOL 500 MG PO TABS
1000.0000 mg | ORAL_TABLET | Freq: Once | ORAL | Status: AC
  Administered 2019-09-04: 1000 mg via ORAL
  Filled 2019-09-04: qty 2

## 2019-09-04 MED ORDER — NAPROXEN 500 MG PO TABS: 500.0000 mg | ORAL_TABLET | Freq: Two times a day (BID) | ORAL | 0 refills | Status: DC

## 2019-09-04 MED ORDER — HYDROCODONE-ACETAMINOPHEN 5-325 MG PO TABS: 1.0000 | ORAL_TABLET | Freq: Four times a day (QID) | ORAL | 0 refills | Status: DC | PRN

## 2019-09-04 MED ORDER — KETOROLAC TROMETHAMINE 30 MG/ML IJ SOLN
30.0000 mg | Freq: Once | INTRAMUSCULAR | Status: AC
  Administered 2019-09-04: 30 mg via INTRAMUSCULAR
  Filled 2019-09-04: qty 1

## 2019-09-04 MED ORDER — SODIUM CHLORIDE 0.9% FLUSH: 3.0000 mL | Freq: Once | INTRAVENOUS | Status: DC

## 2019-09-04 MED ORDER — METHOCARBAMOL 500 MG PO TABS: ORAL_TABLET | ORAL | 0 refills | Status: DC

## 2019-09-04 NOTE — ED Triage Notes (Signed)
C/O lower abdominal and lower back pain x 1 day.    AAOx3.  Skin warm and dry. Ambulates with easy and steady gait.  Denies dysuria.  Drinking a [employer] coffee drink with whipped cream  NAD

## 2019-09-04 NOTE — ED Provider Notes (Signed)
[facility] Emergency Department Provider Note   ____________________________________________   First MD Initiated Contact with Patient 09/04/19 1041     (approximate)  I have reviewed the triage vital signs and the nursing notes.   HISTORY  Chief Complaint Back Pain   HPI Rachel Gregory is a 29 y.o. female presents to the ED with complaint of low back pain that radiates around to her abdomen that started today.  Patient states that she has a history of endometriosis and started her period 2 weeks late.  She states that she has passed some clots.  She denies any nausea, vomiting, diarrhea or constipation.  There is been no fever or chills.  She denies dysuria.  There is been no history of back injury.  She states that movement increases her pain.  No saddle anesthesias or incontinence of bowel or bladder.  Currently she rates her pain as an 8 out of 10.     Past Medical History:  Diagnosis Date  . ADHD (attention deficit hyperactivity disorder)   . Allergy   . Asthma   . Bipolar 1 disorder (HCC)   . Depression   . Endometriosis   . Plantar fasciitis of right foot     Patient Active Problem List   Diagnosis Date Noted  . Tobacco abuse 06/15/2016  . Endometriosis 05/24/2016    Past Surgical History:  Procedure Laterality Date  . CESAREAN SECTION N/A 01/16/2017   Procedure: CESAREAN SECTION;  Surgeon: Levie Heritage, DO;  Location: [facility] BIRTHING SUITES;  Service: Obstetrics;  Laterality: N/A;  . DILATION AND CURETTAGE OF UTERUS  04/2015   spontaneous abortion    Prior to Admission medications   Medication Sig Start Date End Date Taking? Authorizing Provider  HYDROcodone-acetaminophen (NORCO/VICODIN) 5-325 MG tablet Take 1 tablet by mouth every 6 (six) hours as needed for moderate pain. 09/04/19   Tommi Rumps, PA-C  methocarbamol (ROBAXIN) 500 MG tablet 1 or 2 every 6 hours as needed for muscle spasms. 09/04/19   Tommi Rumps, PA-C    naproxen (NAPROSYN) 500 MG tablet Take 1 tablet (500 mg total) by mouth 2 (two) times daily with a meal. 09/04/19   Tommi Rumps, PA-C  norethindrone (MICRONOR) 0.35 MG tablet Take 1 tablet (0.35 mg total) by mouth daily. 11/04/18   Tessa Lerner, NP    Allergies Bee venom  Family History  Problem Relation Age of Onset  . Miscarriages / [place] Mother   . Arthritis Father   . Miscarriages / Stillbirths Maternal Aunt   . Birth defects Maternal Uncle        congenital heart defect  . Birth defects Paternal Uncle        spina bifida  . Miscarriages / Stillbirths Maternal Grandmother   . Miscarriages / Stillbirths Paternal Grandmother     Social History Social History   Tobacco Use  . Smoking status: Current Every Day Smoker    Packs/day: 0.25  . Smokeless tobacco: Never Used  Substance Use Topics  . Alcohol use: Yes    Comment: occ not since pregnant  . Drug use: No    Review of Systems Constitutional: No fever/chills Eyes: No visual changes. ENT: No sore throat. Cardiovascular: Denies chest pain. Respiratory: Denies shortness of breath. Gastrointestinal: No abdominal pain.  No nausea, no vomiting.  No diarrhea.  No constipation. Genitourinary: Negative for dysuria.  Positive history of endometriosis. Musculoskeletal: Positive back pain. Skin: Negative for rash. Neurological: Negative for  headaches, focal weakness or numbness.   ____________________________________________   PHYSICAL EXAM:  VITAL SIGNS: ED Triage Vitals  Enc Vitals Group     BP 09/04/19 0947 122/64     Pulse Rate 09/04/19 0947 62     Resp 09/04/19 0947 16     Temp 09/04/19 0947 98.2 F (36.8 C)     Temp Source 09/04/19 0947 Oral     SpO2 09/04/19 0947 100 %     Weight 09/04/19 0938 235 lb 14.3 oz (107 kg)     Height 09/04/19 0938 5\' 6"  (1.676 m)     Head Circumference --      Peak Flow --      Pain Score 09/04/19 0937 8     Pain Loc --      Pain Edu? --      Excl. in [place]?  --     Constitutional: Alert and oriented. Well appearing and in no acute distress. Eyes: Conjunctivae are normal.  Head: Atraumatic. Neck: No stridor.   Cardiovascular: Normal rate, regular rhythm. Grossly normal heart sounds.  Good peripheral circulation. Respiratory: Normal respiratory effort.  No retractions. Lungs CTAB. Gastrointestinal: Soft and nontender. No distention. No CVA tenderness. Musculoskeletal: No point tenderness is noted on palpation of the thoracic or lumbar spine.  There is however moderate difficulty with range of motion and restriction secondary to pain.  Straight leg raises were positive at approximately 30 to 40 degrees bilaterally.  Straight leg raises increases her low back pain.  Patient is able to move lower extremities without any difficulty and get to a standing position while guarding and slowly moving.  Good muscle strength bilaterally. Neurologic:  Normal speech and language. No gross focal neurologic deficits are appreciated.  Reflexes were equal bilaterally.  No gait instability. Skin:  Skin is warm, dry and intact. No rash noted.  No ecchymosis or discoloration to suggest injury. Psychiatric: Mood and affect are normal. Speech and behavior are normal.  ____________________________________________   LABS (all labs ordered are listed, but only abnormal results are displayed)  Labs Reviewed  COMPREHENSIVE METABOLIC PANEL - Abnormal; Notable for the following components:      Result Value   Glucose, Bld 151 (*)    All other components within normal limits  CBC - Abnormal; Notable for the following components:   RBC 5.26 (*)    MCV 79.5 (*)    MCH 24.5 (*)    RDW 15.7 (*)    All other components within normal limits  URINALYSIS, COMPLETE (UACMP) WITH MICROSCOPIC - Abnormal; Notable for the following components:   Color, Urine YELLOW (*)    APPearance CLEAR (*)    All other components within normal limits  LIPASE, BLOOD  POC URINE PREG, ED  POCT  PREGNANCY, URINE    PROCEDURES  Procedure(s) performed (including Critical Care):  Procedures   ____________________________________________   INITIAL IMPRESSION / ASSESSMENT AND PLAN / ED COURSE  As part of my medical decision making, I reviewed the following data within the electronic MEDICAL RECORD NUMBER Notes from prior ED visits and [place] Controlled Substance Database   29 year old female presents to the ED with complaint of low back pain without history of injury.  Patient was given Toradol 30 mg IM along with Robaxin 1000 mg p.o.  While waiting for lab results patient states that back pain is improving.  Lab work and urinalysis were reassuring and patient was made aware.  Patient was instructed to use ice or heat to  her back.  She was discharged with a prescription for methocarbamol 500 mg 1 or 2 every 6 hours as needed for muscle spasms, naproxen 500 mg twice daily with food and hydrocodone every 6 hours if needed for moderate to severe pain.  She is aware that she cannot take the muscle relaxant or the narcotic while driving or operating machinery.  She is to follow-up with her PCP if any continued problems or return to the emergency room if any severe worsening of her symptoms.  ____________________________________________   FINAL CLINICAL IMPRESSION(S) / ED DIAGNOSES  Final diagnoses:  Lumbosacral pain     ED Discharge Orders         Ordered    methocarbamol (ROBAXIN) 500 MG tablet     Discontinue  Reprint     09/04/19 1218    naproxen (NAPROSYN) 500 MG tablet  2 times daily with meals     Discontinue  Reprint     09/04/19 1218    HYDROcodone-acetaminophen (NORCO/VICODIN) 5-325 MG tablet  Every 6 hours PRN     Discontinue  Reprint     09/04/19 1218           Note:  This document was prepared using Dragon voice recognition software and may include unintentional dictation errors.    Tommi Rumps, PA-C 09/04/19 1510    Miguel Aschoff., MD 09/05/19 2010

## 2019-09-04 NOTE — Discharge Instructions (Addendum)
Follow-up with your primary care provider if any continued problems with your back.  You may use ice or heat to your back and muscles as needed for discomfort.  A prescription for naproxen was sent to your pharmacy to be taken twice a day with food.  Methocarbamol 500 mg 1 or 2 every 6 hours as needed for muscle spasms.  Both this and the hydrocodone can cause drowsiness and increase your risk for injury.  Do not take these 2 medications if you plan on driving or operating machinery.  The hydrocodone is for moderate to severe pain.  Lab work today including urinalysis did not show any signs of infection.

## 2019-09-04 NOTE — ED Notes (Signed)
Pt c/o lower back pain that radiates straight threw to her lower abd today, states she has a hx of endometriosis and just started her period 2 weeks late and has passed a few clots. Pt is in NAD at present. Denies N/V/D/constipation.Marland Kitchen

## 2019-11-25 ENCOUNTER — Other Ambulatory Visit: Payer: Self-pay

## 2019-11-25 ENCOUNTER — Ambulatory Visit (INDEPENDENT_AMBULATORY_CARE_PROVIDER_SITE_OTHER): Payer: Medicaid Other | Admitting: *Deleted

## 2019-11-25 DIAGNOSIS — Z3201 Encounter for pregnancy test, result positive: Secondary | ICD-10-CM | POA: Diagnosis not present

## 2019-11-25 DIAGNOSIS — Z3200 Encounter for pregnancy test, result unknown: Secondary | ICD-10-CM

## 2019-11-25 LAB — POCT PREGNANCY, URINE: Preg Test, Ur: POSITIVE — AB

## 2019-11-25 NOTE — Progress Notes (Signed)
Dropped off urine for a pregnancy test which was positive.  I called Yemaya  And notified her pregnancy test was positive. She reports she had 2 positive home pregnancy tests. She reports LMP 10/23/19 - this makes her [redacted]w[redacted]d with EDD 07/29/20. I reviewed her medications with her and advised her to start prenatal vitamins. I also advised her to call ob provider of her choice to start prenatal care. She voices understanding. Jakaden Ouzts,RN

## 2019-12-10 ENCOUNTER — Encounter: Payer: Self-pay | Admitting: Family Medicine

## 2019-12-29 ENCOUNTER — Telehealth (INDEPENDENT_AMBULATORY_CARE_PROVIDER_SITE_OTHER): Payer: Medicaid Other | Admitting: *Deleted

## 2019-12-29 ENCOUNTER — Other Ambulatory Visit: Payer: Self-pay

## 2019-12-29 DIAGNOSIS — N809 Endometriosis, unspecified: Secondary | ICD-10-CM

## 2019-12-29 DIAGNOSIS — O99210 Obesity complicating pregnancy, unspecified trimester: Secondary | ICD-10-CM | POA: Insufficient documentation

## 2019-12-29 DIAGNOSIS — Z3A00 Weeks of gestation of pregnancy not specified: Secondary | ICD-10-CM

## 2019-12-29 DIAGNOSIS — O0990 Supervision of high risk pregnancy, unspecified, unspecified trimester: Secondary | ICD-10-CM | POA: Insufficient documentation

## 2019-12-29 DIAGNOSIS — O219 Vomiting of pregnancy, unspecified: Secondary | ICD-10-CM

## 2019-12-29 DIAGNOSIS — Z3490 Encounter for supervision of normal pregnancy, unspecified, unspecified trimester: Secondary | ICD-10-CM

## 2019-12-29 MED ORDER — BLOOD PRESSURE KIT DEVI: 1.0000 | 0 refills | Status: DC | PRN

## 2019-12-29 MED ORDER — PROMETHAZINE HCL 25 MG PO TABS: 25.0000 mg | ORAL_TABLET | Freq: Four times a day (QID) | ORAL | 1 refills | Status: DC | PRN

## 2019-12-29 NOTE — Patient Instructions (Signed)
  At [facility], we work as an integrated team, providing care to address both physical and emotional health. Your medical provider may refer you to see our Behavioral Health Clinician (BHC) on the same day you see your medical provider, as availability permits.  Our BHC is available to all patients, visits generally last between 20-30 minutes, but can be longer or shorter, depending on patient need. The BHC offers help with stress management, coping with symptoms of depression and anxiety, major life changes , sleep issues, changing risky behavior, grief and loss, life stress, working on personal life goals, and  behavioral health issues, as these all affect your overall health and wellness.  The BHC is NOT available for the following: FMLA paperwork, court-ordered evaluations, specialty assessments (custody or disability), letters to employers, or obtaining certification for an emotional support animal. The BHC does not provide long-term therapy. You have the right to refuse integrated behavioral health services, or to reschedule to see the BHC at a later date.  Exception: If you are having thoughts of suicide, we require that you either see the BHC for further assessment, or contract for safety with your medical provider. Confidentiality exception: If it is suspected that a child or disabled adult is being abused or neglected, we are required by law to report that to either Child Protective Services or Adult Protective Services.  If you have a diagnosis of Bipolar affective disorder, Schizophrenia, or recurrent Major depressive disorder, we will recommend that you establish care with a psychiatrist, as these are lifelong, chronic conditions, and we want your overall emotional health and medications to be more closely monitored. If you anticipate needing extended maternity leave due to mental illness, it it recommended you inform your medical provider, so  we can put in a referral to a  psychiatrist as soon as possible. The BHC is unable to recommend an extended maternity leave for mental health issues. Your medical provider or BHC may refer you to a therapist for ongoing, traditional therapy, or to a psychiatrist, for medication management, if it would benefit your overall health. Depending on your insurance, you may have a copay to see the BHC. If you are uninsured, it is recommended that you apply for financial assistance. (Forms may be requested at the front desk for in-person visits, via MyChart, or request a form during a virtual visit).  If you see the BHC more than 6 times, you will have to complete a comprehensive clinical assessment interview with the BHC to resume integrated services.  For virtual visits with the BHC, you must be physically in the state of [place] at the time of the visit. For example, if you live in [place], you will have to do an in-person visit with the BHC. If you are going out of the state or country for any reason, the BHC may see you virtually when you return to [place], but not while you are physically outside of [place].    

## 2019-12-29 NOTE — Progress Notes (Signed)
New OB Intake  I connected with  Rachel Gregory on 12/29/19 at  1:15 PM EDT by MyChart and verified that I am speaking with the correct person using two identifiers. Nurse is located at [facility] and pt is located at home.  I discussed the limitations, risks, security and privacy concerns of performing an evaluation and management service by telephone and the availability of in person appointments. I also discussed with the patient that there may be a patient responsible charge related to this service. The patient expressed understanding and agreed to proceed.  I explained I am completing New OB Intake today. We discussed her EDD of 07/29/20 that is based on LMP of 10/23/19. Pt is G3/P1011. I reviewed her allergies, medications, Medical/Surgical/OB history, and appropriate screenings. I informed her of [facility] services. Based on history, this is a/an complicated by endometriosis pregnancy.  Concerns addressed today  Nausea and vomiting in pregnancy- requested zofran. I explained we do not give zofran in first trimester and offered per protocol Phenergan or Diclegis. She elected to try Phenergan.   MyChart/Babyscripts MyChart access verified. I explained pt will have some visits in office and some virtually. Babyscripts instructions given. Account successfully created and app downloaded.  Blood Pressure Cuff Blood pressure cuff ordered for patient to pick-up from [employer]. Explained after first prenatal appt pt will check weekly and document in Babyscripts.  Anatomy US Explained first scheduled [place] will be around 19 weeks. Anatomy US scheduled for 03/01/20 at 1030. Pt notified to arrive at 1015.Scheduled for 02/2020 due to request for Monday appointment and may not be allowed to be off 03/08/20 due to holiday. Labs Discussed Avelina Laine genetic screening with patient. Would like both Panorama and Horizon drawn at new OB visit. Routine prenatal labs needed.  WIC- patient already has WIC.   Covid  vaccine-  Patient has not had Covid vaccine.   First visit review I reviewed new OB appt with pt. I explained she will have a pelvic exam, ob bloodwork with genetic screening, and PAP smear. Explained pt will be seen by Gerrit Heck at first visit; encounter routed to appropriate provider.  Tomika Eckles,RN 12/29/2019  1:14 PM

## 2019-12-30 ENCOUNTER — Telehealth: Payer: Self-pay | Admitting: Family Medicine

## 2019-12-30 DIAGNOSIS — Z3490 Encounter for supervision of normal pregnancy, unspecified, unspecified trimester: Secondary | ICD-10-CM

## 2019-12-30 MED ORDER — BLOOD PRESSURE KIT DEVI: 1.0000 | 0 refills | Status: DC | PRN

## 2019-12-30 NOTE — Telephone Encounter (Signed)
Per chart review, BP cuff prescription order class was "print." Reordered to [employer]. Called pt to explain that prescription has been resent and should be available for pickup. [employer] also offers free shipping for patients located outside of [place].

## 2019-12-30 NOTE — Progress Notes (Signed)
Patient was assessed and managed by nursing staff during this encounter. I have reviewed the chart and agree with the documentation and plan. I have also made any necessary editorial changes.  Cherre Robins, CNM 12/30/2019 9:41 AM

## 2019-12-30 NOTE — Telephone Encounter (Signed)
Blood pressure Kit was not called in to [employer]

## 2019-12-30 NOTE — Telephone Encounter (Signed)
Patient said her Blood Pressure Kit was

## 2019-12-31 DIAGNOSIS — Z3490 Encounter for supervision of normal pregnancy, unspecified, unspecified trimester: Secondary | ICD-10-CM | POA: Diagnosis not present

## 2020-01-05 DIAGNOSIS — Z20822 Contact with and (suspected) exposure to covid-19: Secondary | ICD-10-CM | POA: Diagnosis not present

## 2020-01-12 ENCOUNTER — Other Ambulatory Visit: Payer: Self-pay

## 2020-01-12 ENCOUNTER — Ambulatory Visit (INDEPENDENT_AMBULATORY_CARE_PROVIDER_SITE_OTHER): Payer: Medicaid Other

## 2020-01-12 ENCOUNTER — Encounter: Payer: Self-pay | Admitting: *Deleted

## 2020-01-12 ENCOUNTER — Telehealth: Payer: Self-pay | Admitting: Family Medicine

## 2020-01-12 ENCOUNTER — Other Ambulatory Visit (HOSPITAL_COMMUNITY)
Admission: RE | Admit: 2020-01-12 | Discharge: 2020-01-12 | Disposition: A | Discharge status: Home / Self Care | Payer: Medicaid Other | Source: Ambulatory Visit

## 2020-01-12 VITALS — BP 129/73 | HR 92 | Wt 211.6 lb

## 2020-01-12 DIAGNOSIS — Z3490 Encounter for supervision of normal pregnancy, unspecified, unspecified trimester: Secondary | ICD-10-CM | POA: Diagnosis not present

## 2020-01-12 DIAGNOSIS — Z8659 Personal history of other mental and behavioral disorders: Secondary | ICD-10-CM

## 2020-01-12 DIAGNOSIS — Z3A11 11 weeks gestation of pregnancy: Secondary | ICD-10-CM

## 2020-01-12 DIAGNOSIS — Z3481 Encounter for supervision of other normal pregnancy, first trimester: Secondary | ICD-10-CM | POA: Diagnosis not present

## 2020-01-12 DIAGNOSIS — O99210 Obesity complicating pregnancy, unspecified trimester: Secondary | ICD-10-CM

## 2020-01-12 DIAGNOSIS — Z148 Genetic carrier of other disease: Secondary | ICD-10-CM

## 2020-01-12 DIAGNOSIS — O219 Vomiting of pregnancy, unspecified: Secondary | ICD-10-CM

## 2020-01-12 LAB — POCT URINALYSIS DIP (DEVICE)
Bilirubin Urine: NEGATIVE
Glucose, UA: NEGATIVE mg/dL
Hgb urine dipstick: NEGATIVE
Ketones, ur: 40 mg/dL — AB
Leukocytes,Ua: NEGATIVE
Nitrite: NEGATIVE
Protein, ur: NEGATIVE mg/dL
Specific Gravity, Urine: >=1.03 (ref 1.005–1.030)
Urobilinogen, UA: 0.2 mg/dL (ref 0.0–1.0)
pH: 6 (ref 5.0–8.0)

## 2020-01-12 MED ORDER — ONDANSETRON 4 MG PO TBDP: 4.0000 mg | ORAL_TABLET | Freq: Three times a day (TID) | ORAL | 1 refills | Status: DC | PRN

## 2020-01-12 NOTE — Telephone Encounter (Signed)
Spoke with pt to confirm [facility] appt 01/26/20

## 2020-01-12 NOTE — Patient Instructions (Signed)
  [facility] Providers          [facility]  [address], [place], [place] [address]  [phone]  [facility]  [address], [place], [place] [address]  [phone]  [facility]  [address], [place], [place] [address]  [phone]  [facility] [place] [facility]  [address], [place], [place] [address]  [phone]  [facility]  [address] floor), [place], [place] [address]  [phone]  [facility] [address], [place], [place] [address] [phone]  [facility]  [address], [place], [place] [address]  [phone]  [facility]  [address], [place], [place] [address]  [phone]  [facility]  [address], [place], [place] [address]  [phone]  [facility]  [address], [place], [place] [address]  [phone]  [facility]  [address], [place], [place] [address]  [phone]  [facility]  [address], [place], [place] [address]  [phone]  [facility]   [address], [place], [place] [address]  [phone]  [facility]  [address], [place], [place] [address]   [phone]  [facility]  [address], , [place] [address]  [phone]         
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
## 2020-01-13 LAB — CBC/D/PLT+RPR+RH+ABO+RUB AB...
Antibody Screen: NEGATIVE
Basophils Absolute: 0 10*3/uL (ref 0.0–0.2)
Basos: 0 %
EOS (ABSOLUTE): 0.1 10*3/uL (ref 0.0–0.4)
Eos: 1 %
HCV Ab: <0.1 s/co ratio (ref 0.0–0.9)
HIV Screen 4th Generation wRfx: NONREACTIVE
Hematocrit: 34.6 % (ref 34.0–46.6)
Hemoglobin: 11.5 g/dL (ref 11.1–15.9)
Hepatitis B Surface Ag: NEGATIVE
Immature Grans (Abs): 0 10*3/uL (ref 0.0–0.1)
Immature Granulocytes: 0 %
Lymphocytes Absolute: 1.5 10*3/uL (ref 0.7–3.1)
Lymphs: 17 %
MCH: 27.3 pg (ref 26.6–33.0)
MCHC: 33.2 g/dL (ref 31.5–35.7)
MCV: 82 fL (ref 79–97)
Monocytes Absolute: 0.5 10*3/uL (ref 0.1–0.9)
Monocytes: 6 %
Neutrophils Absolute: 6.5 10*3/uL (ref 1.4–7.0)
Neutrophils: 76 %
Platelets: 142 10*3/uL — ABNORMAL LOW (ref 150–450)
RBC: 4.22 x10E6/uL (ref 3.77–5.28)
RDW: 15.5 % — ABNORMAL HIGH (ref 11.7–15.4)
RPR Ser Ql: NONREACTIVE
Rh Factor: POSITIVE
Rubella Antibodies, IGG: 2.81 index (ref >0.99)
WBC: 8.7 10*3/uL (ref 3.4–10.8)

## 2020-01-13 LAB — HCV INTERPRETATION

## 2020-01-13 LAB — HEMOGLOBIN A1C
Est. average glucose Bld gHb Est-mCnc: 100 mg/dL
Hgb A1c MFr Bld: 5.1 % (ref 4.8–5.6)

## 2020-01-14 LAB — CULTURE, OB URINE

## 2020-01-14 LAB — URINE CULTURE, OB REFLEX: Organism ID, Bacteria: NO GROWTH

## 2020-01-14 LAB — GC/CHLAMYDIA PROBE AMP (~~LOC~~) NOT AT ARMC
Chlamydia: NEGATIVE
Comment: NEGATIVE
Comment: NORMAL
Neisseria Gonorrhea: NEGATIVE

## 2020-01-15 LAB — CYTOLOGY - PAP: Diagnosis: HIGH — AB

## 2020-01-16 DIAGNOSIS — R87613 High grade squamous intraepithelial lesion on cytologic smear of cervix (HGSIL): Secondary | ICD-10-CM | POA: Insufficient documentation

## 2020-01-16 DIAGNOSIS — N879 Dysplasia of cervix uteri, unspecified: Secondary | ICD-10-CM | POA: Insufficient documentation

## 2020-01-19 ENCOUNTER — Telehealth: Payer: Self-pay

## 2020-01-19 NOTE — Telephone Encounter (Addendum)
-----   Message from Gerrit Heck, [place] sent at 01/16/2020 12:16 PM EDT ----- Please call patient and inform of abnormal results and need for colposcopy.  Thanks, Shanda Bumps  Notified pt her results and that we will do the colpo on her appt scheduled for 02/16/20 with Dr. Vergie Living.  Pt verbalized understanding with no further questions. Front office notified to change pt's appt from virtual to in person visit.   Addison Naegeli, RN  01/19/20

## 2020-01-22 ENCOUNTER — Telehealth: Payer: Self-pay | Admitting: Lactation Services

## 2020-01-22 NOTE — Telephone Encounter (Signed)
Called patient to inform her of the results of Horizon Screening.   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 She was informed that it is recommended she call [facility] at [phone] to schedule a Telephone GEnetic Counseling Session to discuss results.   Reviewed with patient that this does not mean anything is wrong with infant and that FOB testing is recommended to see if he also carries the Gene.   Patient voiced understanding. Questions were answered

## 2020-01-22 NOTE — BH Specialist Note (Signed)
Integrated Behavioral Health via Telemedicine Video (Caregility) Visit  01/22/2020 Rachel Gregory [id number]  Number of Integrated Behavioral Health visits: 1 Session Start time: 8:14  Session End time: 8:32 Total time: 18 minutes  Referring Provider: Gerrit Heck, CNM Type of Service: Individual Patient/Family location: [facility] Provider location: [facility]  All persons participating in visit: Patient Rachel Gregory and [facility]    I connected with Rachel Gregory  by a video enabled telemedicine application (Caregility) and verified that I am speaking with the correct person using two identifiers.   Discussed confidentiality: Yes   Confirmed demographics & insurance:  Yes   I discussed that engaging in this virtual visit, they consent to the provision of behavioral healthcare and the services will be billed under their insurance.   Patient and/or legal guardian expressed understanding and consented to virtual visit: Yes   PRESENTING CONCERNS: Patient and/or family reports the following symptoms/concerns: Pt states she was diagnosed with ADHD at "9 or 10", and diagnosed with Bipolar 1 at "29 or 82", when she was in [facility] for 6 months. Pt has not been on BH medication since 29yo (Depakote). Pt had a difficult time emotionally after 29yo was born and taken into NICU for the first 3 days postpartum, and no mood issue since that time. Pt has no further concerns.  Duration of problem: no current problem; Severity of problem: n/a  STRENGTHS (Protective Factors/Coping Skills): Social connections, Social and Emotional competence and Concrete supports in place (healthy food, safe environments, etc.)  ASSESSMENT: Patient currently experiencing At risk for depression.    GOALS ADDRESSED: Patient will: 1.  Maintain reduction of symptoms of: anxiety and depression  2.  Increase knowledge and/or ability of: healthy  habits   Progress of Goals: Achieved  INTERVENTIONS: Interventions utilized:  Psychoeducation and/or Health Education and Link to [employer] Standardized Assessments completed & reviewed: PHQ/GAD given in past two weeks   OUTCOME: Patient Response: Pt agrees to treatment plan   PLAN: 1. Follow up with behavioral health clinician on : As needed 2. Behavioral recommendations:  Chief Operating Officer of [facility] at [email or web address]  -Continue taking prenatal vitamin daily, as recommended by medical provider 3. Referral(s): Integrated [employer] (In Clinic)  I discussed the assessment and treatment plan with the patient and/or parent/guardian. They were provided an opportunity to ask questions and all were answered. They agreed with the plan and demonstrated an understanding of the instructions.   They were advised to call back or seek an in-person evaluation as appropriate.  I discussed that the purpose of this visit is to provide behavioral health care while limiting exposure to the novel coronavirus.  Discussed there is a possibility of technology failure and discussed alternative modes of communication if that failure occurs.  Rachel Gregory Rachel Gregory  Depression screen [facility] 2/9 01/12/2020 12/29/2019 03/27/2017 01/10/2017 12/27/2016  Decreased Interest 0 0 0 0 0  Down, Depressed, Hopeless 0 0 0 0 0  PHQ - 2 Score 0 0 0 0 0  Altered sleeping 1 0 0 0 0  Tired, decreased energy 1 1 0 0 0  Change in appetite 0 0 0 0 0  Feeling bad or failure about yourself  0 0 0 0 0  Trouble concentrating 0 0 0 0 0  Moving slowly or fidgety/restless 0 0 0 0 0  Suicidal thoughts 0 0 0 0 0  PHQ-9 Score 2 1  0 0 0   GAD 7 : Generalized Anxiety Score 01/12/2020 12/29/2019 03/27/2017 01/10/2017  Nervous, Anxious, on Edge 0 0 0 0  Control/stop worrying 0 0 0 0  Worry too much - different things 0 0 0 0  Trouble relaxing 0 0 0 0  Restless 0 0 0 0  Easily  annoyed or irritable 1 1 0 0  Afraid - awful might happen 0 0 0 0  Total GAD 7 Score 1 1 0 0

## 2020-01-26 ENCOUNTER — Ambulatory Visit (INDEPENDENT_AMBULATORY_CARE_PROVIDER_SITE_OTHER): Payer: Medicaid Other | Admitting: Clinical

## 2020-01-26 DIAGNOSIS — Z8659 Personal history of other mental and behavioral disorders: Secondary | ICD-10-CM

## 2020-01-26 DIAGNOSIS — Z3490 Encounter for supervision of normal pregnancy, unspecified, unspecified trimester: Secondary | ICD-10-CM | POA: Diagnosis not present

## 2020-01-26 DIAGNOSIS — Z9189 Other specified personal risk factors, not elsewhere classified: Secondary | ICD-10-CM

## 2020-01-26 NOTE — Patient Instructions (Signed)
[facility] [address] [place], [place] [address] [phone] (main office) [phone] [facility] office)  [email or web address] Teacher, adult education of hospital; class registration, etc.)

## 2020-01-28 ENCOUNTER — Encounter: Payer: Self-pay | Admitting: *Deleted

## 2020-01-29 ENCOUNTER — Encounter: Payer: Self-pay | Admitting: General Practice

## 2020-01-31 DIAGNOSIS — Z148 Genetic carrier of other disease: Secondary | ICD-10-CM | POA: Insufficient documentation

## 2020-01-31 NOTE — Assessment & Plan Note (Signed)
Isovaleric Acidemia

## 2020-01-31 NOTE — Addendum Note (Signed)
Addended by: Gerrit Heck L on: 01/31/2020 01:56 AM   Modules accepted: Orders

## 2020-02-12 ENCOUNTER — Encounter: Payer: Self-pay | Admitting: *Deleted

## 2020-02-16 ENCOUNTER — Other Ambulatory Visit: Payer: Self-pay

## 2020-02-16 ENCOUNTER — Ambulatory Visit (INDEPENDENT_AMBULATORY_CARE_PROVIDER_SITE_OTHER): Payer: Medicaid Other | Admitting: Obstetrics and Gynecology

## 2020-02-16 ENCOUNTER — Other Ambulatory Visit (HOSPITAL_COMMUNITY)
Admission: RE | Admit: 2020-02-16 | Discharge: 2020-02-16 | Disposition: A | Discharge status: Home / Self Care | Payer: Medicaid Other | Source: Ambulatory Visit | Attending: Obstetrics and Gynecology | Admitting: Obstetrics and Gynecology

## 2020-02-16 VITALS — BP 117/71 | HR 71 | Wt 217.0 lb

## 2020-02-16 DIAGNOSIS — R87613 High grade squamous intraepithelial lesion on cytologic smear of cervix (HGSIL): Secondary | ICD-10-CM | POA: Diagnosis not present

## 2020-02-16 DIAGNOSIS — O99119 Other diseases of the blood and blood-forming organs and certain disorders involving the immune mechanism complicating pregnancy, unspecified trimester: Secondary | ICD-10-CM

## 2020-02-16 DIAGNOSIS — D696 Thrombocytopenia, unspecified: Secondary | ICD-10-CM | POA: Diagnosis not present

## 2020-02-16 DIAGNOSIS — N879 Dysplasia of cervix uteri, unspecified: Secondary | ICD-10-CM

## 2020-02-16 DIAGNOSIS — O99210 Obesity complicating pregnancy, unspecified trimester: Secondary | ICD-10-CM

## 2020-02-16 DIAGNOSIS — Z3490 Encounter for supervision of normal pregnancy, unspecified, unspecified trimester: Secondary | ICD-10-CM | POA: Diagnosis not present

## 2020-02-16 DIAGNOSIS — Z3A16 16 weeks gestation of pregnancy: Secondary | ICD-10-CM

## 2020-02-16 DIAGNOSIS — E669 Obesity, unspecified: Secondary | ICD-10-CM

## 2020-02-17 LAB — PROTEIN / CREATININE RATIO, URINE
Creatinine, Urine: 68.3 mg/dL
Protein, Ur: 6.2 mg/dL
Protein/Creat Ratio: 91 mg/g{creat} (ref 0–200)

## 2020-02-18 ENCOUNTER — Encounter: Payer: Self-pay | Admitting: Obstetrics and Gynecology

## 2020-02-18 HISTORY — PX: COLPOSCOPY: PRO47

## 2020-02-18 LAB — AFP, SERUM, OPEN SPINA BIFIDA
AFP MoM: 1.07
AFP Value: 30.4 ng/mL
Gest. Age on Collection Date: 16.4 weeks
Maternal Age At EDD: 27.5 yr
OSBR Risk 1 IN: 9862
Test Results:: NEGATIVE
Weight: 217 [lb_av]

## 2020-02-18 LAB — CBC
Hematocrit: 37 % (ref 34.0–46.6)
Hemoglobin: 12.3 g/dL (ref 11.1–15.9)
MCH: 27.4 pg (ref 26.6–33.0)
MCHC: 33.2 g/dL (ref 31.5–35.7)
MCV: 82 fL (ref 79–97)
Platelets: 125 10*3/uL — ABNORMAL LOW (ref 150–450)
RBC: 4.49 x10E6/uL (ref 3.77–5.28)
RDW: 14.6 % (ref 11.7–15.4)
WBC: 8.3 10*3/uL (ref 3.4–10.8)

## 2020-02-18 LAB — SURGICAL PATHOLOGY

## 2020-02-18 LAB — TSH: TSH: 0.904 u[IU]/mL (ref 0.450–4.500)

## 2020-02-18 NOTE — Procedures (Signed)
Colposcopy Procedure Note  Pre-operative Diagnosis: Pregnancy at 16/4.  01/2020 pap: HSIL 06/2016 pap: negative  Patient states she has a history of abnormal paps and has had colposcopies before; she denies any surgeries on her cervix.   Post-operative Diagnosis: CIN 2   Procedure Details   The risks (including infection, bleeding, pain) and benefits of the procedure were explained to the patient and written informed consent was obtained.  The patient was placed in the dorsal lithotomy position. A Graves was speculum inserted in the vagina, and the cervix was visualized.  AA staining done Lugol's with green filter.  Biopsy from 6 o'clock. No bleeding after procedure after application of Monsel's.  Findings: at 6 o'clock, AWE changes and increased vascularity.   Adequate: No  Specimens: 6 o'clock.  Condition: Stable  Complications: None  Plan: The patient was advised to call for any fever or for prolonged or severe pain or bleeding. She was advised to use OTC analgesics as needed for mild to moderate pain. She was advised to avoid vaginal intercourse for 48 hours or until the bleeding has completely stopped.  Follow up pathology. At least f/u 6 weeks PP. May need repeat colposcopy during pregnancy based on pathology today.    Cornelia Copa MD Attending Center for [employer] [facility])

## 2020-02-18 NOTE — Progress Notes (Signed)
Prenatal Visit Note Date: 02/16/2020 Clinic: [facility]  Subjective:  Rachel Gregory is a 29 y.o. G3P1011 at [redacted]w[redacted]d being seen today for ongoing prenatal care.  She is currently monitored for the following issues for this low-risk pregnancy and has Endometriosis; Tobacco abuse; Obesity (BMI 30-39.9); Supervision of low-risk pregnancy; Obesity in pregnancy; HSIL (high grade squamous intraepithelial lesion) on Pap smear of cervix; Carrier of genetic disorder; and Benign gestational thrombocytopenia, antepartum (HCC) on their problem list.  Patient reports no complaints.   Contractions: Not present. Vag. Bleeding: None.  Movement: Absent. Denies leaking of fluid.   The following portions of the patient's history were reviewed and updated as appropriate: allergies, current medications, past family history, past medical history, past social history, past surgical history and problem list. Problem list updated.  Objective:   Vitals:   02/16/20 1108  BP: 117/71  Pulse: 71  Weight: 217 lb (98.4 kg)    Fetal Status: Fetal Heart Rate (bpm): 142   Movement: Absent     General:  Alert, oriented and cooperative. Patient is in no acute distress.  Skin: Skin is warm and dry. No rash noted.   Cardiovascular: Normal heart rate noted  Respiratory: Normal respiratory effort, no problems with respiration noted  Abdomen: Soft, gravid, appropriate for gestational age. Pain/Pressure: Present     Pelvic:  Cervical exam deferred        Extremities: Normal range of motion.  Edema: None  Mental Status: Normal mood and affect. Normal behavior. Normal judgment and thought content.   Urinalysis:      Assessment and Plan:  Pregnancy: G3P1011 at [redacted]w[redacted]d  1. Benign gestational thrombocytopenia, antepartum (HCC) Recheck today. Hold off on asa.  CBC Latest Ref Rng & Units 01/12/2020 09/04/2019  WBC 3.4 - 10.8 x10E3/uL 8.7 6.9  Hemoglobin 11.1 - 15.9 g/dL 47.0 92.9  Hematocrit 57.4 - 46.6 %  34.6 41.8  Platelets 150 - 450 x10E3/uL 142(L) 185     2. Encounter for supervision of low-risk pregnancy, antepartum Amenable to afp. Anatomy u/s already scheduled. Low risk panorama - CBC - AFP, Serum, Open Spina Bifida - TSH - Protein / creatinine ratio, urine  3. Obesity (BMI 30-39.9)  4. Obesity in pregnancy  5. 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 Follow up at least 6-12 weeks PP. Follow up path from today - Surgical pathology( [place]/ [facility])  Preterm labor symptoms and general obstetric precautions including but not limited to vaginal bleeding, contractions, leaking of fluid and fetal movement were reviewed in detail with the patient. Please refer to After Visit Summary for other counseling recommendations.  Return in about 1 month (around 03/18/2020) for low risk, in person, md or app.   [place] Bing, MD

## 2020-03-01 ENCOUNTER — Ambulatory Visit: Payer: Medicaid Other | Attending: Obstetrics and Gynecology

## 2020-03-01 ENCOUNTER — Other Ambulatory Visit: Payer: Self-pay

## 2020-03-01 ENCOUNTER — Ambulatory Visit: Payer: Medicaid Other | Admitting: *Deleted

## 2020-03-01 ENCOUNTER — Encounter: Payer: Self-pay | Admitting: *Deleted

## 2020-03-01 ENCOUNTER — Other Ambulatory Visit: Payer: Self-pay | Admitting: *Deleted

## 2020-03-01 DIAGNOSIS — N809 Endometriosis, unspecified: Secondary | ICD-10-CM

## 2020-03-01 DIAGNOSIS — E669 Obesity, unspecified: Secondary | ICD-10-CM | POA: Diagnosis not present

## 2020-03-01 DIAGNOSIS — Z3A18 18 weeks gestation of pregnancy: Secondary | ICD-10-CM

## 2020-03-01 DIAGNOSIS — O99210 Obesity complicating pregnancy, unspecified trimester: Secondary | ICD-10-CM

## 2020-03-01 DIAGNOSIS — Z3490 Encounter for supervision of normal pregnancy, unspecified, unspecified trimester: Secondary | ICD-10-CM

## 2020-03-01 DIAGNOSIS — Z6832 Body mass index (BMI) 32.0-32.9, adult: Secondary | ICD-10-CM

## 2020-03-01 DIAGNOSIS — O99891 Other specified diseases and conditions complicating pregnancy: Secondary | ICD-10-CM

## 2020-03-01 DIAGNOSIS — Z148 Genetic carrier of other disease: Secondary | ICD-10-CM

## 2020-03-01 DIAGNOSIS — D696 Thrombocytopenia, unspecified: Secondary | ICD-10-CM

## 2020-03-01 DIAGNOSIS — N879 Dysplasia of cervix uteri, unspecified: Secondary | ICD-10-CM

## 2020-03-01 DIAGNOSIS — Z363 Encounter for antenatal screening for malformations: Secondary | ICD-10-CM

## 2020-03-01 DIAGNOSIS — O99112 Other diseases of the blood and blood-forming organs and certain disorders involving the immune mechanism complicating pregnancy, second trimester: Secondary | ICD-10-CM

## 2020-03-01 DIAGNOSIS — O99212 Obesity complicating pregnancy, second trimester: Secondary | ICD-10-CM

## 2020-03-01 DIAGNOSIS — O99332 Smoking (tobacco) complicating pregnancy, second trimester: Secondary | ICD-10-CM

## 2020-03-01 DIAGNOSIS — F17200 Nicotine dependence, unspecified, uncomplicated: Secondary | ICD-10-CM

## 2020-03-18 ENCOUNTER — Ambulatory Visit (INDEPENDENT_AMBULATORY_CARE_PROVIDER_SITE_OTHER): Payer: Medicaid Other | Admitting: Obstetrics & Gynecology

## 2020-03-18 ENCOUNTER — Other Ambulatory Visit: Payer: Self-pay

## 2020-03-18 DIAGNOSIS — Z3492 Encounter for supervision of normal pregnancy, unspecified, second trimester: Secondary | ICD-10-CM

## 2020-03-18 NOTE — Patient Instructions (Signed)
Vaginal Birth After Cesarean Delivery  Vaginal birth after cesarean delivery (VBAC) is giving birth vaginally after previously delivering a baby through a cesarean section (C-section). A VBAC may be a safe option for you, depending on your health and other factors. It is important to discuss VBAC with your health care provider early in your pregnancy so you can understand the risks, benefits, and options. Having these discussions early will give you time to make your birth plan. Who are the best candidates for VBAC? The best candidates for VBAC are women who:  Have had one or two prior cesarean deliveries, and the incision made during the delivery was horizontal (low transverse).  Do not have a vertical (classical) scar on their uterus.  Have not had a tear in the wall of their uterus (uterine rupture).  Plan to have more pregnancies. A VBAC is also more likely to be successful:  In women who have previously given birth vaginally.  When labor starts by itself (spontaneously) before the due date. What are the benefits of VBAC? The benefits of delivering your baby vaginally instead of by a cesarean delivery include:  A shorter hospital stay.  A faster recovery time.  Less pain.  Avoiding risks associated with major surgery, such as infection and blood clots.  Less blood loss and less need for donated blood (transfusions). What are the risks of VBAC? The main risk of attempting a VBAC is that it may fail, forcing your health care provider to deliver your baby by a C-section. Other risks are rare and include:  Tearing (rupture) of the scar from a past cesarean delivery.  Other risks associated with vaginal deliveries. If a repeat cesarean delivery is needed, the risks include:  Blood loss.  Infection.  Blood clot.  Damage to surrounding organs.  Removal of the uterus (hysterectomy), if it is damaged.  Placenta problems in future pregnancies. What else should I know  about my options? Delivering a baby through a VBAC is similar to having a normal spontaneous vaginal delivery. Therefore, it is safe:  To try with twins.  For your health care provider to try to turn the baby from a breech position (external cephalic version) during labor.  With epidural analgesia for pain relief. Consider where you would like to deliver your baby. VBAC should be attempted in facilities where an emergency cesarean delivery can be performed. VBAC is not recommended for home births. Any changes in your health or your baby's health during your pregnancy may make it necessary to change your initial decision about VBAC. Your health care provider may recommend that you do not attempt a VBAC if:  Your baby's suspected weight is 8.8 lb (4 kg) or more.  You have preeclampsia. This is a condition that causes high blood pressure along with other symptoms, such as swelling and headaches.  You will have VBAC less than 19 months after your cesarean delivery.  You are past your due date.  You need to have labor started (induced) because your cervix is not ready for labor (unfavorable). Where to find more information  American Pregnancy Association: americanpregnancy.org  American Congress of Obstetricians and Gynecologists: acog.org Summary  Vaginal birth after cesarean delivery (VBAC) is giving birth vaginally after previously delivering a baby through a cesarean section (C-section). A VBAC may be a safe option for you, depending on your health and other factors.  Discuss VBAC with your health care provider early in your pregnancy so you can understand the risks, benefits, options, and   have plenty of time to make your birth plan.  The main risk of attempting a VBAC is that it may fail, forcing your health care provider to deliver your baby by a C-section. Other risks are rare. This information is not intended to replace advice given to you by your health care provider. Make sure  you discuss any questions you have with your health care provider. Document Revised: 06/25/2018 Document Reviewed: 06/06/2016 Elsevier Patient Education  2020 Elsevier Inc.  

## 2020-03-18 NOTE — Progress Notes (Signed)
   PRENATAL VISIT NOTE  Subjective:  Rachel Gregory is a 30 y.o. G3P1011 at [redacted]w[redacted]d being seen today for ongoing prenatal care.  She is currently monitored for the following issues for this low-risk pregnancy and has Endometriosis; Tobacco abuse; Obesity (BMI 30-39.9); Supervision of low-risk pregnancy; Obesity in pregnancy; Cervical dysplasia; Carrier of genetic disorder; and Benign gestational thrombocytopenia, antepartum (HCC) on their problem list.  Patient reports no complaints.  Contractions: Not present. Vag. Bleeding: None.  Movement: Present. Denies leaking of fluid.   The following portions of the patient's history were reviewed and updated as appropriate: allergies, current medications, past family history, past medical history, past social history, past surgical history and problem list.   Objective:   Vitals:   03/18/20 1057  BP: (!) 120/59  Pulse: 74  Weight: 226 lb 1.6 oz (102.6 kg)    Fetal Status: Fetal Heart Rate (bpm): 140   Movement: Present     General:  Alert, oriented and cooperative. Patient is in no acute distress.  Skin: Skin is warm and dry. No rash noted.   Cardiovascular: Normal heart rate noted  Respiratory: Normal respiratory effort, no problems with respiration noted  Abdomen: Soft, gravid, appropriate for gestational age.  Pain/Pressure: Absent     Pelvic: Cervical exam deferred        Extremities: Normal range of motion.  Edema: None  Mental Status: Normal mood and affect. Normal behavior. Normal judgment and thought content.   Assessment and Plan:  Pregnancy: G3P1011 at [redacted]w[redacted]d 1. Encounter for supervision of low-risk pregnancy in second trimester F/u US for completion of fetal anatomy in 2 weeks  Preterm labor symptoms and general obstetric precautions including but not limited to vaginal bleeding, contractions, leaking of fluid and fetal movement were reviewed in detail with the patient. Please refer to After Visit Summary for other counseling  recommendations.   Return in about 5 weeks (around 04/22/2020) for 2 hr GTT.  Future Appointments  Date Time Provider Department Center  03/29/2020  9:00 AM [facility] NURSE [facility]  03/29/2020  9:15 AM [facility] US2 [facility]  04/21/2020  8:20 AM [facility] LAB [facility]  04/21/2020  9:15 AM Armando Reichert, CNM [facility]    Scheryl Darter, MD

## 2020-03-29 ENCOUNTER — Ambulatory Visit: Payer: Self-pay

## 2020-03-29 ENCOUNTER — Ambulatory Visit: Payer: Medicaid Other

## 2020-04-07 ENCOUNTER — Other Ambulatory Visit: Payer: Self-pay

## 2020-04-07 ENCOUNTER — Ambulatory Visit (HOSPITAL_BASED_OUTPATIENT_CLINIC_OR_DEPARTMENT_OTHER): Payer: Medicaid Other

## 2020-04-07 ENCOUNTER — Encounter: Payer: Self-pay | Admitting: *Deleted

## 2020-04-07 ENCOUNTER — Other Ambulatory Visit: Payer: Self-pay | Admitting: *Deleted

## 2020-04-07 ENCOUNTER — Ambulatory Visit: Payer: Medicaid Other | Attending: Obstetrics and Gynecology | Admitting: *Deleted

## 2020-04-07 DIAGNOSIS — O34219 Maternal care for unspecified type scar from previous cesarean delivery: Secondary | ICD-10-CM

## 2020-04-07 DIAGNOSIS — N879 Dysplasia of cervix uteri, unspecified: Secondary | ICD-10-CM

## 2020-04-07 DIAGNOSIS — Z148 Genetic carrier of other disease: Secondary | ICD-10-CM | POA: Insufficient documentation

## 2020-04-07 DIAGNOSIS — E669 Obesity, unspecified: Secondary | ICD-10-CM | POA: Insufficient documentation

## 2020-04-07 DIAGNOSIS — O99112 Other diseases of the blood and blood-forming organs and certain disorders involving the immune mechanism complicating pregnancy, second trimester: Secondary | ICD-10-CM | POA: Diagnosis not present

## 2020-04-07 DIAGNOSIS — O26892 Other specified pregnancy related conditions, second trimester: Secondary | ICD-10-CM | POA: Insufficient documentation

## 2020-04-07 DIAGNOSIS — Z3A23 23 weeks gestation of pregnancy: Secondary | ICD-10-CM | POA: Insufficient documentation

## 2020-04-07 DIAGNOSIS — O99012 Anemia complicating pregnancy, second trimester: Secondary | ICD-10-CM | POA: Insufficient documentation

## 2020-04-07 DIAGNOSIS — O99212 Obesity complicating pregnancy, second trimester: Secondary | ICD-10-CM

## 2020-04-07 DIAGNOSIS — D696 Thrombocytopenia, unspecified: Secondary | ICD-10-CM | POA: Insufficient documentation

## 2020-04-07 DIAGNOSIS — D649 Anemia, unspecified: Secondary | ICD-10-CM | POA: Diagnosis not present

## 2020-04-07 DIAGNOSIS — Z6832 Body mass index (BMI) 32.0-32.9, adult: Secondary | ICD-10-CM

## 2020-04-07 DIAGNOSIS — O99210 Obesity complicating pregnancy, unspecified trimester: Secondary | ICD-10-CM

## 2020-04-07 DIAGNOSIS — Z363 Encounter for antenatal screening for malformations: Secondary | ICD-10-CM | POA: Insufficient documentation

## 2020-04-07 DIAGNOSIS — F17210 Nicotine dependence, cigarettes, uncomplicated: Secondary | ICD-10-CM | POA: Insufficient documentation

## 2020-04-07 DIAGNOSIS — O321XX0 Maternal care for breech presentation, not applicable or unspecified: Secondary | ICD-10-CM | POA: Diagnosis not present

## 2020-04-07 DIAGNOSIS — N809 Endometriosis, unspecified: Secondary | ICD-10-CM

## 2020-04-07 DIAGNOSIS — F17200 Nicotine dependence, unspecified, uncomplicated: Secondary | ICD-10-CM

## 2020-04-07 DIAGNOSIS — O99332 Smoking (tobacco) complicating pregnancy, second trimester: Secondary | ICD-10-CM | POA: Insufficient documentation

## 2020-04-07 DIAGNOSIS — O99891 Other specified diseases and conditions complicating pregnancy: Secondary | ICD-10-CM | POA: Diagnosis not present

## 2020-04-19 ENCOUNTER — Other Ambulatory Visit: Payer: Medicaid Other

## 2020-04-21 ENCOUNTER — Other Ambulatory Visit: Payer: Medicaid Other

## 2020-04-21 ENCOUNTER — Encounter: Payer: Medicaid Other | Admitting: Advanced Practice Midwife

## 2020-04-22 ENCOUNTER — Telehealth: Payer: Self-pay | Admitting: Lactation Services

## 2020-04-22 NOTE — Telephone Encounter (Signed)
Called patient. She want to know when her milk will come in. She reports that she did not have milk come in until she is in labor.   She reports she did not have enough milk for her older son. He was fed breast milk and formula. She started pumping while in the hospital and pumped for 4 months.   She is concerned she will not be able to provide enough milk for her infant. Reviewed colostrum production and milk coming to volume with pregnancy.   She is planning to get a pump from [facility]. Reviewed there has to be special circumstances to get a pump from [facility] and she may not qualify. Reviewed trying to get a DEBP like [employer], [employer] or [employer] if she can.   Reviewed prenatal hand expression after 37 weeks to save colostrum for hospital and hand expression and pumping after delivery if infant is not nursing effectively.   Reviewed OP Lactation services post delivery also.   Mom voiced understanding. Mom to call back with questions or concerns as needed.

## 2020-04-26 ENCOUNTER — Other Ambulatory Visit: Payer: Self-pay | Admitting: *Deleted

## 2020-04-26 ENCOUNTER — Encounter: Payer: Medicaid Other | Admitting: Obstetrics & Gynecology

## 2020-04-26 ENCOUNTER — Other Ambulatory Visit: Payer: Medicaid Other

## 2020-04-26 DIAGNOSIS — Z3490 Encounter for supervision of normal pregnancy, unspecified, unspecified trimester: Secondary | ICD-10-CM

## 2020-04-28 ENCOUNTER — Other Ambulatory Visit: Payer: Self-pay

## 2020-04-28 ENCOUNTER — Other Ambulatory Visit: Payer: Medicaid Other

## 2020-04-28 ENCOUNTER — Ambulatory Visit (INDEPENDENT_AMBULATORY_CARE_PROVIDER_SITE_OTHER): Payer: Medicaid Other | Admitting: Family Medicine

## 2020-04-28 VITALS — BP 134/63 | HR 73 | Wt 238.4 lb

## 2020-04-28 DIAGNOSIS — Z23 Encounter for immunization: Secondary | ICD-10-CM

## 2020-04-28 DIAGNOSIS — Z3490 Encounter for supervision of normal pregnancy, unspecified, unspecified trimester: Secondary | ICD-10-CM

## 2020-04-28 DIAGNOSIS — Z98891 History of uterine scar from previous surgery: Secondary | ICD-10-CM

## 2020-04-28 DIAGNOSIS — O99119 Other diseases of the blood and blood-forming organs and certain disorders involving the immune mechanism complicating pregnancy, unspecified trimester: Secondary | ICD-10-CM

## 2020-04-28 DIAGNOSIS — O99210 Obesity complicating pregnancy, unspecified trimester: Secondary | ICD-10-CM

## 2020-04-28 DIAGNOSIS — D696 Thrombocytopenia, unspecified: Secondary | ICD-10-CM

## 2020-04-28 DIAGNOSIS — R87613 High grade squamous intraepithelial lesion on cytologic smear of cervix (HGSIL): Secondary | ICD-10-CM

## 2020-04-28 NOTE — Progress Notes (Signed)
   PRENATAL VISIT NOTE  Subjective:  Rachel Gregory is a 30 y.o. G3P1011 at [redacted]w[redacted]d being seen today for ongoing prenatal care.  She is currently monitored for the following issues for this low-risk pregnancy and has Endometriosis; Tobacco abuse; Obesity (BMI 30-39.9); Supervision of low-risk pregnancy; Obesity in pregnancy; Cervical dysplasia; Carrier of genetic disorder; and Benign gestational thrombocytopenia, antepartum (HCC) on their problem list.  Patient reports no complaints.  Contractions: Not present. Vag. Bleeding: None.  Movement: Present. Denies leaking of fluid.   The following portions of the patient's history were reviewed and updated as appropriate: allergies, current medications, past family history, past medical history, past social history, past surgical history and problem list.   Objective:   Vitals:   04/28/20 0905  BP: 134/63  Pulse: 73  Weight: 238 lb 6.4 oz (108.1 kg)    Fetal Status: Fetal Heart Rate (bpm): 150 Fundal Height: 27 cm Movement: Present     General:  Alert, oriented and cooperative. Patient is in no acute distress.  Skin: Skin is warm and dry. No rash noted.   Cardiovascular: Normal heart rate noted  Respiratory: Normal respiratory effort, no problems with respiration noted  Abdomen: Soft, gravid, appropriate for gestational age.  Pain/Pressure: Present     Pelvic: Cervical exam deferred        Extremities: Normal range of motion.  Edema: Trace  Mental Status: Normal mood and affect. Normal behavior. Normal judgment and thought content.   Assessment and Plan:  Pregnancy: G3P1011 at [redacted]w[redacted]d 1. Encounter for supervision of low-risk pregnancy, antepartum FHT and FH normal 2hr GTT today with CBC - Tdap vaccine greater than or equal to 7yo IM  2. Obesity in pregnancy  3. Benign gestational thrombocytopenia, antepartum (HCC) Repeat platelets now  4. 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  Repeat colpo next appt  5. History of C/section Plans repeat cesarean section  Preterm labor symptoms and general obstetric precautions including but not limited to vaginal bleeding, contractions, leaking of fluid and fetal movement were reviewed in detail with the patient. Please refer to After Visit Summary for other counseling recommendations.   Return in about 4 weeks (around 05/26/2020) for HR OB f/u, colposcopy with Dr Vergie Living (3-4 weeks).  Future Appointments  Date Time Provider Department Center  06/02/2020  7:45 AM [facility] NURSE [facility]  06/02/2020  8:00 AM [facility] US1 [facility]    Levie Heritage, DO

## 2020-04-29 LAB — CBC
Hematocrit: 39.6 % (ref 34.0–46.6)
Hemoglobin: 12.8 g/dL (ref 11.1–15.9)
MCH: 27.4 pg (ref 26.6–33.0)
MCHC: 32.3 g/dL (ref 31.5–35.7)
MCV: 85 fL (ref 79–97)
Platelets: 142 10*3/uL — ABNORMAL LOW (ref 150–450)
RBC: 4.67 x10E6/uL (ref 3.77–5.28)
RDW: 13.8 % (ref 11.7–15.4)
WBC: 7.7 10*3/uL (ref 3.4–10.8)

## 2020-04-29 LAB — GLUCOSE TOLERANCE, 2 HOURS W/ 1HR
Glucose, 1 hour: 144 mg/dL (ref 65–179)
Glucose, 2 hour: 122 mg/dL (ref 65–152)
Glucose, Fasting: 84 mg/dL (ref 65–91)

## 2020-04-29 LAB — RPR: RPR Ser Ql: NONREACTIVE

## 2020-04-29 LAB — HIV ANTIBODY (ROUTINE TESTING W REFLEX): HIV Screen 4th Generation wRfx: NONREACTIVE

## 2020-05-24 ENCOUNTER — Encounter: Payer: Self-pay | Admitting: Obstetrics and Gynecology

## 2020-05-24 ENCOUNTER — Ambulatory Visit (INDEPENDENT_AMBULATORY_CARE_PROVIDER_SITE_OTHER): Payer: Medicaid Other | Admitting: Obstetrics and Gynecology

## 2020-05-24 ENCOUNTER — Other Ambulatory Visit: Payer: Self-pay

## 2020-05-24 VITALS — BP 122/84 | HR 73 | Wt 247.7 lb

## 2020-05-24 DIAGNOSIS — N879 Dysplasia of cervix uteri, unspecified: Secondary | ICD-10-CM

## 2020-05-24 DIAGNOSIS — O0990 Supervision of high risk pregnancy, unspecified, unspecified trimester: Secondary | ICD-10-CM | POA: Diagnosis not present

## 2020-05-24 DIAGNOSIS — O219 Vomiting of pregnancy, unspecified: Secondary | ICD-10-CM

## 2020-05-24 DIAGNOSIS — O99210 Obesity complicating pregnancy, unspecified trimester: Secondary | ICD-10-CM

## 2020-05-24 DIAGNOSIS — D696 Thrombocytopenia, unspecified: Secondary | ICD-10-CM

## 2020-05-24 DIAGNOSIS — O99119 Other diseases of the blood and blood-forming organs and certain disorders involving the immune mechanism complicating pregnancy, unspecified trimester: Secondary | ICD-10-CM

## 2020-05-24 DIAGNOSIS — E669 Obesity, unspecified: Secondary | ICD-10-CM

## 2020-05-24 DIAGNOSIS — Z3A30 30 weeks gestation of pregnancy: Secondary | ICD-10-CM

## 2020-05-24 HISTORY — PX: COLPOSCOPY: PRO47

## 2020-05-24 LAB — CBC
Hematocrit: 37.3 % (ref 34.0–46.6)
Hemoglobin: 12.1 g/dL (ref 11.1–15.9)
MCH: 27.3 pg (ref 26.6–33.0)
MCHC: 32.4 g/dL (ref 31.5–35.7)
MCV: 84 fL (ref 79–97)
Platelets: 129 10*3/uL — ABNORMAL LOW (ref 150–450)
RBC: 4.43 x10E6/uL (ref 3.77–5.28)
RDW: 14 % (ref 11.7–15.4)
WBC: 7.8 10*3/uL (ref 3.4–10.8)

## 2020-05-24 MED ORDER — ONDANSETRON 4 MG PO TBDP: 4.0000 mg | ORAL_TABLET | Freq: Three times a day (TID) | ORAL | 1 refills | Status: DC | PRN

## 2020-05-24 NOTE — Procedures (Signed)
Colposcopy Procedure Note  Pre-operative Diagnosis: Pregnancy at 30/4. HGSIL pap. Pregnancy colpo with CIN 1  Post-operative Diagnosis: HGSIL. No e/o cancer  Procedure Details    The risks (including infection, bleeding, pain) and benefits of the procedure were explained to the patient and written informed consent was obtained.  The patient was placed in the dorsal lithotomy position. A Graves was speculum inserted in the vagina, and the cervix was visualized.  AA staining done. Biopsy not done. No bleeding after procedure  Findings: diffuse AWE changes circumferentially. 6 o'clock area with increased erythema and vascularity similar to prior colposcopy  Visually cervix is closed and long  Adequate: Yes  Specimens: None  Condition: Stable  Complications: None  Plan: Discussed plan is for rpt colpo and to take some biopsies around 6 weeks postpartum   Cornelia Copa MD Attending Center for [employer] Midwife)

## 2020-05-24 NOTE — Progress Notes (Signed)
Prenatal Visit Note Date: 05/24/2020 Clinic[facility]  Subjective:  Rachel Gregory is a 30 y.o. G3P1011 at [redacted]w[redacted]d being seen today for ongoing prenatal care.  She is currently monitored for the following issues for this high-risk pregnancy and has Endometriosis; Tobacco abuse; Obesity (BMI 30-39.9); Supervision of high risk pregnancy, antepartum; Obesity in pregnancy; Cervical dysplasia; Carrier of genetic disorder; and Benign gestational thrombocytopenia, antepartum (HCC) on their problem list.  Patient reports no complaints.   Contractions: Irritability. Vag. Bleeding: None.  Movement: Present. Denies leaking of fluid.   The following portions of the patient's history were reviewed and updated as appropriate: allergies, current medications, past family history, past medical history, past social history, past surgical history and problem list. Problem list updated.  Objective:   Vitals:   05/24/20 0918  BP: 122/84  Pulse: 73  Weight: 247 lb 11.2 oz (112.4 kg)    Fetal Status: Fetal Heart Rate (bpm): 137   Movement: Present     General:  Alert, oriented and cooperative. Patient is in no acute distress.  Skin: Skin is warm and dry. No rash noted.   Cardiovascular: Normal heart rate noted  Respiratory: Normal respiratory effort, no problems with respiration noted  Abdomen: Soft, gravid, appropriate for gestational age. Pain/Pressure: Present     Pelvic:  Cervical exam performed        Visually closed long and high. See procedure note for colpo  Extremities: Normal range of motion.  Edema: Trace  Mental Status: Normal mood and affect. Normal behavior. Normal judgment and thought content.   Urinalysis:      Assessment and Plan:  Pregnancy: G3P1011 at [redacted]w[redacted]d  1. Cervical dysplasia colpo done. Stable. No biopsies taken. D/w her plan is for 6-12 week PP colpo and biopsies  2. Benign gestational thrombocytopenia, antepartum (HCC) Surveillance labs today.  Recommend qmonth until 36wks and then q7-10d CBC Latest Ref Rng & Units 04/28/2020 02/16/2020 01/12/2020  WBC 3.4 - 10.8 x10E3/uL 7.7 8.3 8.7  Hemoglobin 11.1 - 15.9 g/dL 78.6 76.7 20.9  Hematocrit 34.0 - 46.6 % 39.6 37.0 34.6  Platelets 150 - 450 x10E3/uL 142(L) 125(L) 142(L)   - CBC  3. Obesity in pregnancy Has rpt growth next week. Watch TWG. Up 9lbs since 2/16 visit  4. Obesity (BMI 30-39.9)  5. Supervision of high risk pregnancy, antepartum D/w pt more re: BC nv - CBC  6. [redacted] weeks gestation of pregnancy  Preterm labor symptoms and general obstetric precautions including but not limited to vaginal bleeding, contractions, leaking of fluid and fetal movement were reviewed in detail with the patient. Please refer to After Visit Summary for other counseling recommendations.  Return in about 2 weeks (around 06/07/2020) for in person.   [place] Bing, MD

## 2020-06-02 ENCOUNTER — Encounter: Payer: Self-pay | Admitting: *Deleted

## 2020-06-02 ENCOUNTER — Other Ambulatory Visit: Payer: Self-pay

## 2020-06-02 ENCOUNTER — Ambulatory Visit: Payer: Medicaid Other | Admitting: *Deleted

## 2020-06-02 ENCOUNTER — Ambulatory Visit: Payer: Medicaid Other | Attending: Obstetrics and Gynecology

## 2020-06-02 ENCOUNTER — Other Ambulatory Visit: Payer: Self-pay | Admitting: *Deleted

## 2020-06-02 DIAGNOSIS — Z87891 Personal history of nicotine dependence: Secondary | ICD-10-CM | POA: Diagnosis not present

## 2020-06-02 DIAGNOSIS — N809 Endometriosis, unspecified: Secondary | ICD-10-CM

## 2020-06-02 DIAGNOSIS — O99210 Obesity complicating pregnancy, unspecified trimester: Secondary | ICD-10-CM

## 2020-06-02 DIAGNOSIS — O0990 Supervision of high risk pregnancy, unspecified, unspecified trimester: Secondary | ICD-10-CM

## 2020-06-02 DIAGNOSIS — Z3A31 31 weeks gestation of pregnancy: Secondary | ICD-10-CM

## 2020-06-02 DIAGNOSIS — Z363 Encounter for antenatal screening for malformations: Secondary | ICD-10-CM

## 2020-06-02 DIAGNOSIS — Z148 Genetic carrier of other disease: Secondary | ICD-10-CM

## 2020-06-02 DIAGNOSIS — O99213 Obesity complicating pregnancy, third trimester: Secondary | ICD-10-CM

## 2020-06-02 DIAGNOSIS — R638 Other symptoms and signs concerning food and fluid intake: Secondary | ICD-10-CM

## 2020-06-02 DIAGNOSIS — Z6832 Body mass index (BMI) 32.0-32.9, adult: Secondary | ICD-10-CM

## 2020-06-02 DIAGNOSIS — O3443 Maternal care for other abnormalities of cervix, third trimester: Secondary | ICD-10-CM

## 2020-06-02 DIAGNOSIS — O34219 Maternal care for unspecified type scar from previous cesarean delivery: Secondary | ICD-10-CM | POA: Diagnosis not present

## 2020-06-02 DIAGNOSIS — E669 Obesity, unspecified: Secondary | ICD-10-CM | POA: Diagnosis not present

## 2020-06-02 DIAGNOSIS — O99113 Other diseases of the blood and blood-forming organs and certain disorders involving the immune mechanism complicating pregnancy, third trimester: Secondary | ICD-10-CM

## 2020-06-02 DIAGNOSIS — O3483 Maternal care for other abnormalities of pelvic organs, third trimester: Secondary | ICD-10-CM

## 2020-06-02 DIAGNOSIS — N879 Dysplasia of cervix uteri, unspecified: Secondary | ICD-10-CM

## 2020-06-02 DIAGNOSIS — F17210 Nicotine dependence, cigarettes, uncomplicated: Secondary | ICD-10-CM

## 2020-06-02 DIAGNOSIS — D696 Thrombocytopenia, unspecified: Secondary | ICD-10-CM

## 2020-06-02 DIAGNOSIS — O99333 Smoking (tobacco) complicating pregnancy, third trimester: Secondary | ICD-10-CM

## 2020-06-09 ENCOUNTER — Other Ambulatory Visit: Payer: Self-pay

## 2020-06-09 ENCOUNTER — Ambulatory Visit (INDEPENDENT_AMBULATORY_CARE_PROVIDER_SITE_OTHER): Payer: Medicaid Other | Admitting: Family Medicine

## 2020-06-09 ENCOUNTER — Encounter: Payer: Self-pay | Admitting: General Practice

## 2020-06-09 VITALS — BP 119/64 | HR 65 | Wt 250.0 lb

## 2020-06-09 DIAGNOSIS — D696 Thrombocytopenia, unspecified: Secondary | ICD-10-CM

## 2020-06-09 DIAGNOSIS — O99119 Other diseases of the blood and blood-forming organs and certain disorders involving the immune mechanism complicating pregnancy, unspecified trimester: Secondary | ICD-10-CM

## 2020-06-09 DIAGNOSIS — Z98891 History of uterine scar from previous surgery: Secondary | ICD-10-CM

## 2020-06-09 DIAGNOSIS — Z3A32 32 weeks gestation of pregnancy: Secondary | ICD-10-CM

## 2020-06-09 DIAGNOSIS — Z148 Genetic carrier of other disease: Secondary | ICD-10-CM

## 2020-06-09 DIAGNOSIS — O0990 Supervision of high risk pregnancy, unspecified, unspecified trimester: Secondary | ICD-10-CM

## 2020-06-09 NOTE — Progress Notes (Signed)
   PRENATAL VISIT NOTE  Subjective:  Rachel Gregory is a 30 y.o. G3P1011 at [redacted]w[redacted]d being seen today for ongoing prenatal care.  She is currently monitored for the following issues for this high-risk pregnancy and has Endometriosis; Tobacco abuse; Obesity (BMI 30-39.9); Supervision of high risk pregnancy, antepartum; Obesity in pregnancy; Cervical dysplasia; Carrier of genetic disorder; and Benign gestational thrombocytopenia, antepartum (HCC) on their problem list.  Patient reports no complaints.  Contractions: Irritability. Vag. Bleeding: None.  Movement: Present. Denies leaking of fluid.   The following portions of the patient's history were reviewed and updated as appropriate: allergies, current medications, past family history, past medical history, past social history, past surgical history and problem list.   Objective:   Vitals:   06/09/20 0949  BP: 119/64  Pulse: 65  Weight: 250 lb (113.4 kg)    Fetal Status: Fetal Heart Rate (bpm): 140 Fundal Height: 34 cm Movement: Present     General:  Alert, oriented and cooperative. Patient is in no acute distress.  Skin: Skin is warm and dry. No rash noted.   Cardiovascular: Normal heart rate noted  Respiratory: Normal respiratory effort, no problems with respiration noted  Abdomen: Soft, gravid, appropriate for gestational age.  Pain/Pressure: Absent     Pelvic: Cervical exam deferred        Extremities: Normal range of motion.  Edema: Trace  Mental Status: Normal mood and affect. Normal behavior. Normal judgment and thought content.   Assessment and Plan:  Pregnancy: G3P1011 at [redacted]w[redacted]d 1. [redacted] weeks gestation of pregnancy  2. Supervision of high risk pregnancy, antepartum FHT normal  3. Benign gestational thrombocytopenia, antepartum (HCC) Rpt BCB in 2-4 weeks  4. Carrier of genetic disorder  5. History of cesarean section Desires repeat c/s  Preterm labor symptoms and general obstetric precautions including but not limited  to vaginal bleeding, contractions, leaking of fluid and fetal movement were reviewed in detail with the patient. Please refer to After Visit Summary for other counseling recommendations.   No follow-ups on file.  Future Appointments  Date Time Provider Department Center  06/30/2020 10:45 AM [facility] NURSE [facility]  06/30/2020 11:00 AM [facility]    Levie Heritage, DO

## 2020-06-23 ENCOUNTER — Ambulatory Visit (INDEPENDENT_AMBULATORY_CARE_PROVIDER_SITE_OTHER): Payer: Medicaid Other | Admitting: Obstetrics and Gynecology

## 2020-06-23 ENCOUNTER — Encounter: Payer: Self-pay | Admitting: Obstetrics and Gynecology

## 2020-06-23 VITALS — BP 117/56 | HR 67 | Wt 261.0 lb

## 2020-06-23 DIAGNOSIS — N879 Dysplasia of cervix uteri, unspecified: Secondary | ICD-10-CM

## 2020-06-23 DIAGNOSIS — E669 Obesity, unspecified: Secondary | ICD-10-CM

## 2020-06-23 DIAGNOSIS — D696 Thrombocytopenia, unspecified: Secondary | ICD-10-CM

## 2020-06-23 DIAGNOSIS — O0990 Supervision of high risk pregnancy, unspecified, unspecified trimester: Secondary | ICD-10-CM

## 2020-06-23 DIAGNOSIS — Z3A34 34 weeks gestation of pregnancy: Secondary | ICD-10-CM

## 2020-06-23 DIAGNOSIS — O99210 Obesity complicating pregnancy, unspecified trimester: Secondary | ICD-10-CM

## 2020-06-23 DIAGNOSIS — O99119 Other diseases of the blood and blood-forming organs and certain disorders involving the immune mechanism complicating pregnancy, unspecified trimester: Secondary | ICD-10-CM

## 2020-06-23 LAB — CBC
Hematocrit: 37.2 % (ref 34.0–46.6)
Hemoglobin: 12.4 g/dL (ref 11.1–15.9)
MCH: 27.6 pg (ref 26.6–33.0)
MCHC: 33.3 g/dL (ref 31.5–35.7)
MCV: 83 fL (ref 79–97)
Platelets: 151 10*3/uL (ref 150–450)
RBC: 4.49 x10E6/uL (ref 3.77–5.28)
RDW: 13.9 % (ref 11.7–15.4)
WBC: 10.2 10*3/uL (ref 3.4–10.8)

## 2020-06-23 NOTE — Progress Notes (Signed)
   PRENATAL VISIT NOTE  Subjective:  Rachel Gregory is a 30 y.o. G3P1011 at [redacted]w[redacted]d being seen today for ongoing prenatal care.  She is currently monitored for the following issues for this high-risk pregnancy and has Endometriosis; Tobacco abuse; Obesity (BMI 30-39.9); Supervision of high risk pregnancy, antepartum; Obesity in pregnancy; Cervical dysplasia; Carrier of genetic disorder; and Benign gestational thrombocytopenia, antepartum (HCC) on their problem list.  Patient reports b/l LE edema.  Contractions: Irritability. Vag. Bleeding: None.  Movement: Present. Denies leaking of fluid.   The following portions of the patient's history were reviewed and updated as appropriate: allergies, current medications, past family history, past medical history, past social history, past surgical history and problem list.   Objective:   Vitals:   06/23/20 1401  BP: (!) 117/56  Pulse: 67  Weight: 261 lb (118.4 kg)    Fetal Status: Fetal Heart Rate (bpm): 154 Fundal Height: 36 cm Movement: Present     General:  Alert, oriented and cooperative. Patient is in no acute distress.  Skin: Skin is warm and dry. No rash noted.   Cardiovascular: Normal heart rate noted  Respiratory: Normal respiratory effort, no problems with respiration noted  Abdomen: Soft, gravid, appropriate for gestational age.  Pain/Pressure: Absent     Pelvic: Cervical exam deferred        Extremities: Normal range of motion.  Edema: Trace  Mental Status: Normal mood and affect. Normal behavior. Normal judgment and thought content.   Assessment and Plan:  Pregnancy: G3P1011 at [redacted]w[redacted]d 1. Benign gestational thrombocytopenia, antepartum (HCC) Rpt today. CBC Latest Ref Rng & Units 05/24/2020 04/28/2020 02/16/2020  WBC 3.4 - 10.8 x10E3/uL 7.8 7.7 8.3  Hemoglobin 11.1 - 15.9 g/dL 45.6 25.6 38.9  Hematocrit 34.0 - 46.6 % 37.3 39.6 37.0  Platelets 150 - 450 x10E3/uL 129(L) 142(L) 125(L)   - CBC  2. Supervision of high risk  pregnancy, antepartum Routine care. D/w her re: BC options. Pt undecided. Rpt growth next week - CBC  3. Cervical dysplasia Rpt colpo PP  4. Obesity in pregnancy 11lbs since last visit. BPs normal. 1 to 2+ LE edema. TED hoses recommended.   5. Obesity (BMI 30-39.9)  Preterm labor symptoms and general obstetric precautions including but not limited to vaginal bleeding, contractions, leaking of fluid and fetal movement were reviewed in detail with the patient. Please refer to After Visit Summary for other counseling recommendations.   Return in about 2 weeks (around 07/07/2020) for in person.  Future Appointments  Date Time Provider Department Center  06/30/2020 10:45 AM [facility] NURSE [facility]  06/30/2020 11:00 AM [facility]    [place] Bing, MD

## 2020-06-23 NOTE — Progress Notes (Signed)
No concrens

## 2020-06-23 NOTE — Progress Notes (Signed)
Patient complains of "lightening pain" in vaginal area, happens every other day and swelling of legs

## 2020-06-23 NOTE — Patient Instructions (Signed)
Compression stockings that are around 20-52mmHg in tightness

## 2020-06-30 ENCOUNTER — Other Ambulatory Visit: Payer: Self-pay

## 2020-06-30 ENCOUNTER — Ambulatory Visit: Payer: Medicaid Other | Admitting: *Deleted

## 2020-06-30 ENCOUNTER — Ambulatory Visit (INDEPENDENT_AMBULATORY_CARE_PROVIDER_SITE_OTHER): Payer: Medicaid Other | Admitting: Family Medicine

## 2020-06-30 ENCOUNTER — Encounter: Payer: Self-pay | Admitting: *Deleted

## 2020-06-30 ENCOUNTER — Other Ambulatory Visit (HOSPITAL_COMMUNITY)
Admission: RE | Admit: 2020-06-30 | Discharge: 2020-06-30 | Disposition: A | Discharge status: Home / Self Care | Payer: Medicaid Other | Source: Ambulatory Visit | Attending: Family Medicine | Admitting: Family Medicine

## 2020-06-30 ENCOUNTER — Ambulatory Visit: Payer: Medicaid Other | Attending: Obstetrics

## 2020-06-30 VITALS — BP 130/69 | HR 65 | Wt 260.2 lb

## 2020-06-30 DIAGNOSIS — N879 Dysplasia of cervix uteri, unspecified: Secondary | ICD-10-CM | POA: Diagnosis not present

## 2020-06-30 DIAGNOSIS — O0990 Supervision of high risk pregnancy, unspecified, unspecified trimester: Secondary | ICD-10-CM | POA: Insufficient documentation

## 2020-06-30 DIAGNOSIS — O99210 Obesity complicating pregnancy, unspecified trimester: Secondary | ICD-10-CM | POA: Diagnosis present

## 2020-06-30 DIAGNOSIS — O3443 Maternal care for other abnormalities of cervix, third trimester: Secondary | ICD-10-CM

## 2020-06-30 DIAGNOSIS — D696 Thrombocytopenia, unspecified: Secondary | ICD-10-CM

## 2020-06-30 DIAGNOSIS — E669 Obesity, unspecified: Secondary | ICD-10-CM

## 2020-06-30 DIAGNOSIS — Z148 Genetic carrier of other disease: Secondary | ICD-10-CM

## 2020-06-30 DIAGNOSIS — O34219 Maternal care for unspecified type scar from previous cesarean delivery: Secondary | ICD-10-CM

## 2020-06-30 DIAGNOSIS — O99113 Other diseases of the blood and blood-forming organs and certain disorders involving the immune mechanism complicating pregnancy, third trimester: Secondary | ICD-10-CM

## 2020-06-30 DIAGNOSIS — O99213 Obesity complicating pregnancy, third trimester: Secondary | ICD-10-CM | POA: Diagnosis not present

## 2020-06-30 DIAGNOSIS — O99333 Smoking (tobacco) complicating pregnancy, third trimester: Secondary | ICD-10-CM

## 2020-06-30 DIAGNOSIS — Z3A35 35 weeks gestation of pregnancy: Secondary | ICD-10-CM | POA: Diagnosis not present

## 2020-06-30 DIAGNOSIS — R638 Other symptoms and signs concerning food and fluid intake: Secondary | ICD-10-CM | POA: Diagnosis not present

## 2020-06-30 DIAGNOSIS — O99119 Other diseases of the blood and blood-forming organs and certain disorders involving the immune mechanism complicating pregnancy, unspecified trimester: Secondary | ICD-10-CM

## 2020-06-30 DIAGNOSIS — F17210 Nicotine dependence, cigarettes, uncomplicated: Secondary | ICD-10-CM

## 2020-06-30 NOTE — Progress Notes (Signed)
   PRENATAL VISIT NOTE  Subjective:  Rachel Gregory is a 30 y.o. G3P1011 at [redacted]w[redacted]d being seen today for ongoing prenatal care.  She is currently monitored for the following issues for this high-risk pregnancy and has Endometriosis; Tobacco abuse; Obesity (BMI 30-39.9); Supervision of high risk pregnancy, antepartum; Obesity in pregnancy; Cervical dysplasia; Carrier of genetic disorder; and Benign gestational thrombocytopenia, antepartum (HCC) on their problem list.  Patient reports no complaints.  Contractions: Irritability. Vag. Bleeding: None.  Movement: Present. Denies leaking of fluid.   The following portions of the patient's history were reviewed and updated as appropriate: allergies, current medications, past family history, past medical history, past social history, past surgical history and problem list.   Objective:   Vitals:   06/30/20 1354  BP: 130/69  Pulse: 65  Weight: 260 lb 3.2 oz (118 kg)    Fetal Status: Fetal Heart Rate (bpm): 135 Fundal Height: 37 cm Movement: Present     General:  Alert, oriented and cooperative. Patient is in no acute distress.  Skin: Skin is warm and dry. No rash noted.   Cardiovascular: Normal heart rate noted  Respiratory: Normal respiratory effort, no problems with respiration noted  Abdomen: Soft, gravid, appropriate for gestational age.  Pain/Pressure: Present     Pelvic: Cervical exam deferred        Extremities: Normal range of motion.  Edema: Trace  Mental Status: Normal mood and affect. Normal behavior. Normal judgment and thought content.   Assessment and Plan:  Pregnancy: G3P1011 at [redacted]w[redacted]d 1. Supervision of high risk pregnancy, antepartum FHT and FH normal  2. Benign gestational thrombocytopenia, antepartum (HCC) Last platelet count normal. Recheck in 1 week  3. Obesity (BMI 30-39.9)  4. 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 Previous cesarean delivery affecting pregnancy Scheduled for Rpt at 39  weeks  6. [redacted] weeks gestation of pregnancy   Preterm labor symptoms and general obstetric precautions including but not limited to vaginal bleeding, contractions, leaking of fluid and fetal movement were reviewed in detail with the patient. Please refer to After Visit Summary for other counseling recommendations.   Return in about 1 week (around 07/07/2020) for HR OB f/u.  No future appointments.  Levie Heritage, DO

## 2020-06-30 NOTE — Progress Notes (Signed)
Pressure on pelvic 

## 2020-07-01 LAB — GC/CHLAMYDIA PROBE AMP (~~LOC~~) NOT AT ARMC
Chlamydia: NEGATIVE
Comment: NEGATIVE
Comment: NORMAL
Neisseria Gonorrhea: NEGATIVE

## 2020-07-03 LAB — CULTURE, BETA STREP (GROUP B ONLY): Strep Gp B Culture: POSITIVE — AB

## 2020-07-05 ENCOUNTER — Encounter: Payer: Self-pay | Admitting: Family Medicine

## 2020-07-05 DIAGNOSIS — O99820 Streptococcus B carrier state complicating pregnancy: Secondary | ICD-10-CM | POA: Insufficient documentation

## 2020-07-07 ENCOUNTER — Encounter: Payer: Medicaid Other | Admitting: Obstetrics and Gynecology

## 2020-07-08 ENCOUNTER — Encounter: Payer: Medicaid Other | Admitting: Obstetrics and Gynecology

## 2020-07-08 ENCOUNTER — Encounter: Payer: Medicaid Other | Admitting: Medical

## 2020-07-08 ENCOUNTER — Encounter: Payer: Self-pay | Admitting: Medical

## 2020-07-08 ENCOUNTER — Encounter: Payer: Self-pay | Admitting: General Practice

## 2020-07-08 DIAGNOSIS — Z3A37 37 weeks gestation of pregnancy: Secondary | ICD-10-CM

## 2020-07-08 NOTE — Progress Notes (Signed)
I connected with  [name] on 07/08/20 at 10:00 AM EDT by MyChart Virtual Video Visit and verified that I am speaking with the correct person using two identifiers.   I discussed the limitations, risks, security and privacy concerns of performing an evaluation and management service by telephone and the availability of in person appointments. I also discussed with the patient that there may be a patient responsible charge related to this service. The patient expressed understanding and agreed to proceed.  Guy Begin, CMA 07/08/2020  10:01 AM

## 2020-07-08 NOTE — Progress Notes (Signed)
Patient did not show for MyChart visit

## 2020-07-08 NOTE — Patient Instructions (Signed)
Fetal Movement Counts Patient Name: ________________________________________________ Patient Due Date: ____________________  What is a fetal movement count? A fetal movement count is the number of times that you feel your baby move during a certain amount of time. This may also be called a fetal kick count. A fetal movement count is recommended for every pregnant woman. You may be asked to start counting fetal movements as early as week 28 of your pregnancy. Pay attention to when your baby is most active. You may notice your baby's sleep and wake cycles. You may also notice things that make your baby move more. You should do a fetal movement count:  When your baby is normally most active.  At the same time each day. A good time to count movements is while you are resting, after having something to eat and drink. How do I count fetal movements? 1. Find a quiet, comfortable area. Sit, or lie down on your side. 2. Write down the date, the start time and stop time, and the number of movements that you felt between those two times. Take this information with you to your health care visits. 3. Write down your start time when you feel the first movement. 4. Count kicks, flutters, swishes, rolls, and jabs. You should feel at least 10 movements. 5. You may stop counting after you have felt 10 movements, or if you have been counting for 2 hours. Write down the stop time. 6. If you do not feel 10 movements in 2 hours, contact your health care provider for further instructions. Your health care provider may want to do additional tests to assess your baby's well-being. Contact a health care provider if:  You feel fewer than 10 movements in 2 hours.  Your baby is not moving like he or she usually does. Date: ____________ Start time: ____________ Stop time: ____________ Movements: ____________ Date: ____________ Start time: ____________ Stop time: ____________ Movements: ____________ Date: ____________  Start time: ____________ Stop time: ____________ Movements: ____________ Date: ____________ Start time: ____________ Stop time: ____________ Movements: ____________ Date: ____________ Start time: ____________ Stop time: ____________ Movements: ____________ Date: ____________ Start time: ____________ Stop time: ____________ Movements: ____________ Date: ____________ Start time: ____________ Stop time: ____________ Movements: ____________ Date: ____________ Start time: ____________ Stop time: ____________ Movements: ____________ Date: ____________ Start time: ____________ Stop time: ____________ Movements: ____________ This information is not intended to replace advice given to you by your health care provider. Make sure you discuss any questions you have with your health care provider. Document Revised: 10/17/2018 Document Reviewed: 10/17/2018 Elsevier Patient Education  2021 [employer] Rosen's Emergency Medicine: Concepts and Clinical Practice (9th ed., pp. 2296- 2312). Elsevier.">    Braxton Hicks Contractions Contractions of the uterus can occur throughout pregnancy, but they are not always a sign that you are in labor. You may have practice contractions called Braxton Hicks contractions. These false labor contractions are sometimes confused with true labor. What are Braxton Hicks contractions? Braxton Hicks contractions are tightening movements that occur in the muscles of the uterus before labor. Unlike true labor contractions, these contractions do not result in opening (dilation) and thinning of the cervix. Toward the end of pregnancy (32-34 weeks), Braxton Hicks contractions can happen more often and may become stronger. These contractions are sometimes difficult to tell apart from true labor because they can be very uncomfortable. You should not feel embarrassed if you go to the hospital with false labor. Sometimes, the only way to tell if you are in true labor is   for your health care  provider to look for changes in the cervix. The health care provider will do a physical exam and may monitor your contractions. If you are not in true labor, the exam should show that your cervix is not dilating and your water has not broken. If there are no other health problems associated with your pregnancy, it is completely safe for you to be sent home with false labor. You may continue to have Braxton Hicks contractions until you go into true labor. How to tell the difference between true labor and false labor True labor  Contractions last 30-70 seconds.  Contractions become very regular.  Discomfort is usually felt in the top of the uterus, and it spreads to the lower abdomen and low back.  Contractions do not go away with walking.  Contractions usually become more intense and increase in frequency.  The cervix dilates and gets thinner. False labor  Contractions are usually shorter and not as strong as true labor contractions.  Contractions are usually irregular.  Contractions are often felt in the front of the lower abdomen and in the groin.  Contractions may go away when you walk around or change positions while lying down.  Contractions get weaker and are shorter-lasting as time goes on.  The cervix usually does not dilate or become thin. Follow these instructions at home:  Take over-the-counter and prescription medicines only as told by your health care provider.  Keep up with your usual exercises and follow other instructions from your health care provider.  Eat and drink lightly if you think you are going into labor.  If Braxton Hicks contractions are making you uncomfortable: ? Change your position from lying down or resting to walking, or change from walking to resting. ? Sit and rest in a tub of warm water. ? Drink enough fluid to keep your urine pale yellow. Dehydration may cause these contractions. ? Do slow and deep breathing several times an hour.  Keep  all follow-up prenatal visits as told by your health care provider. This is important.   Contact a health care provider if:  You have a fever.  You have continuous pain in your abdomen. Get help right away if:  Your contractions become stronger, more regular, and closer together.  You have fluid leaking or gushing from your vagina.  You pass blood-tinged mucus (bloody show).  You have bleeding from your vagina.  You have low back pain that you never had before.  You feel your baby's head pushing down and causing pelvic pressure.  Your baby is not moving inside you as much as it used to. Summary  Contractions that occur before labor are called Braxton Hicks contractions, false labor, or practice contractions.  Braxton Hicks contractions are usually shorter, weaker, farther apart, and less regular than true labor contractions. True labor contractions usually become progressively stronger and regular, and they become more frequent.  Manage discomfort from Braxton Hicks contractions by changing position, resting in a warm bath, drinking plenty of water, or practicing deep breathing. This information is not intended to replace advice given to you by your health care provider. Make sure you discuss any questions you have with your health care provider. Document Revised: 02/09/2017 Document Reviewed: 07/13/2016 Elsevier Patient Education  2021 Elsevier Inc.  

## 2020-07-08 NOTE — Progress Notes (Signed)
Pressure on pelvic when baby moves and occasional swollen feet

## 2020-07-12 ENCOUNTER — Inpatient Hospital Stay (HOSPITAL_COMMUNITY)
Admission: AD | Admit: 2020-07-12 | Discharge: 2020-07-14 | DRG: 787 | Disposition: A | Discharge status: Home / Self Care | Payer: Medicaid Other | Attending: Family Medicine | Admitting: Family Medicine

## 2020-07-12 ENCOUNTER — Telehealth: Payer: Self-pay | Admitting: General Practice

## 2020-07-12 ENCOUNTER — Inpatient Hospital Stay (HOSPITAL_COMMUNITY): Payer: Medicaid Other | Admitting: Anesthesiology

## 2020-07-12 ENCOUNTER — Encounter (HOSPITAL_COMMUNITY): Payer: Self-pay

## 2020-07-12 ENCOUNTER — Encounter (HOSPITAL_COMMUNITY)
Admission: AD | Disposition: A | Discharge status: Home / Self Care | Payer: Self-pay | Source: Home / Self Care | Attending: Family Medicine

## 2020-07-12 DIAGNOSIS — O99214 Obesity complicating childbirth: Secondary | ICD-10-CM | POA: Diagnosis not present

## 2020-07-12 DIAGNOSIS — Z20822 Contact with and (suspected) exposure to covid-19: Secondary | ICD-10-CM | POA: Diagnosis present

## 2020-07-12 DIAGNOSIS — O34211 Maternal care for low transverse scar from previous cesarean delivery: Secondary | ICD-10-CM | POA: Diagnosis not present

## 2020-07-12 DIAGNOSIS — O99210 Obesity complicating pregnancy, unspecified trimester: Secondary | ICD-10-CM | POA: Diagnosis present

## 2020-07-12 DIAGNOSIS — Z3A37 37 weeks gestation of pregnancy: Secondary | ICD-10-CM

## 2020-07-12 DIAGNOSIS — E71110 Isovaleric acidemia: Secondary | ICD-10-CM | POA: Diagnosis present

## 2020-07-12 DIAGNOSIS — O4292 Full-term premature rupture of membranes, unspecified as to length of time between rupture and onset of labor: Principal | ICD-10-CM | POA: Diagnosis present

## 2020-07-12 DIAGNOSIS — N809 Endometriosis, unspecified: Secondary | ICD-10-CM | POA: Diagnosis present

## 2020-07-12 DIAGNOSIS — O99119 Other diseases of the blood and blood-forming organs and certain disorders involving the immune mechanism complicating pregnancy, unspecified trimester: Secondary | ICD-10-CM | POA: Diagnosis present

## 2020-07-12 DIAGNOSIS — O0990 Supervision of high risk pregnancy, unspecified, unspecified trimester: Secondary | ICD-10-CM

## 2020-07-12 DIAGNOSIS — Z30430 Encounter for insertion of intrauterine contraceptive device: Secondary | ICD-10-CM | POA: Diagnosis not present

## 2020-07-12 DIAGNOSIS — N879 Dysplasia of cervix uteri, unspecified: Secondary | ICD-10-CM | POA: Diagnosis present

## 2020-07-12 DIAGNOSIS — O9912 Other diseases of the blood and blood-forming organs and certain disorders involving the immune mechanism complicating childbirth: Secondary | ICD-10-CM | POA: Diagnosis not present

## 2020-07-12 DIAGNOSIS — D6959 Other secondary thrombocytopenia: Secondary | ICD-10-CM | POA: Diagnosis present

## 2020-07-12 DIAGNOSIS — O99824 Streptococcus B carrier state complicating childbirth: Secondary | ICD-10-CM | POA: Diagnosis not present

## 2020-07-12 DIAGNOSIS — O34219 Maternal care for unspecified type scar from previous cesarean delivery: Secondary | ICD-10-CM | POA: Diagnosis present

## 2020-07-12 DIAGNOSIS — Z975 Presence of (intrauterine) contraceptive device: Secondary | ICD-10-CM

## 2020-07-12 DIAGNOSIS — O99334 Smoking (tobacco) complicating childbirth: Secondary | ICD-10-CM | POA: Diagnosis present

## 2020-07-12 DIAGNOSIS — F17210 Nicotine dependence, cigarettes, uncomplicated: Secondary | ICD-10-CM | POA: Diagnosis not present

## 2020-07-12 DIAGNOSIS — D696 Thrombocytopenia, unspecified: Secondary | ICD-10-CM | POA: Diagnosis not present

## 2020-07-12 DIAGNOSIS — O4202 Full-term premature rupture of membranes, onset of labor within 24 hours of rupture: Secondary | ICD-10-CM | POA: Diagnosis not present

## 2020-07-12 DIAGNOSIS — O99820 Streptococcus B carrier state complicating pregnancy: Secondary | ICD-10-CM

## 2020-07-12 LAB — CBC
HCT: 41.5 % (ref 36.0–46.0)
Hemoglobin: 13.1 g/dL (ref 12.0–15.0)
MCH: 27.4 pg (ref 26.0–34.0)
MCHC: 31.6 g/dL (ref 30.0–36.0)
MCV: 86.8 fL (ref 80.0–100.0)
Platelets: 157 10*3/uL (ref 150–400)
RBC: 4.78 MIL/uL (ref 3.87–5.11)
RDW: 14.7 % (ref 11.5–15.5)
WBC: 10.8 10*3/uL — ABNORMAL HIGH (ref 4.0–10.5)
nRBC: 0 % (ref 0.0–0.2)

## 2020-07-12 LAB — TYPE AND SCREEN
ABO/RH(D): A POS
Antibody Screen: NEGATIVE

## 2020-07-12 LAB — RESP PANEL BY RT-PCR (FLU A&B, COVID) ARPGX2
Influenza A by PCR: NEGATIVE
Influenza B by PCR: NEGATIVE
SARS Coronavirus 2 by RT PCR: NEGATIVE

## 2020-07-12 LAB — POCT FERN TEST: POCT Fern Test: POSITIVE

## 2020-07-12 SURGERY — Surgical Case
Anesthesia: Spinal

## 2020-07-12 SURGERY — Surgical Case
Anesthesia: Regional

## 2020-07-12 MED ORDER — ACETAMINOPHEN 500 MG PO TABS
1000.0000 mg | ORAL_TABLET | Freq: Four times a day (QID) | ORAL | Status: AC
  Administered 2020-07-12 (×2): 1000 mg via ORAL
  Filled 2020-07-12 (×2): qty 2

## 2020-07-12 MED ORDER — DIPHENHYDRAMINE HCL 25 MG PO CAPS: 25.0000 mg | ORAL_CAPSULE | ORAL | Status: DC | PRN

## 2020-07-12 MED ORDER — BUPIVACAINE IN DEXTROSE 0.75-8.25 % IT SOLN
INTRATHECAL | Status: DC | PRN
  Administered 2020-07-12: 1.8 mL via INTRATHECAL

## 2020-07-12 MED ORDER — DIPHENHYDRAMINE HCL 50 MG/ML IJ SOLN: 12.5000 mg | INTRAMUSCULAR | Status: DC | PRN

## 2020-07-12 MED ORDER — SOD CITRATE-CITRIC ACID 500-334 MG/5ML PO SOLN
30.0000 mL | ORAL | Status: AC
  Administered 2020-07-12: 30 mL via ORAL
  Filled 2020-07-12: qty 15

## 2020-07-12 MED ORDER — OXYCODONE HCL 5 MG/5ML PO SOLN: 5.0000 mg | Freq: Once | ORAL | Status: DC | PRN

## 2020-07-12 MED ORDER — CEFAZOLIN SODIUM-DEXTROSE 2-4 GM/100ML-% IV SOLN
INTRAVENOUS | Status: AC
  Filled 2020-07-12: qty 100

## 2020-07-12 MED ORDER — PROMETHAZINE HCL 25 MG/ML IJ SOLN
6.2500 mg | Freq: Four times a day (QID) | INTRAMUSCULAR | Status: DC | PRN
  Administered 2020-07-12: 6.25 mg via INTRAVENOUS

## 2020-07-12 MED ORDER — ONDANSETRON HCL 4 MG/2ML IJ SOLN
INTRAMUSCULAR | Status: AC
  Filled 2020-07-12: qty 2

## 2020-07-12 MED ORDER — DEXMEDETOMIDINE (PRECEDEX) IN NS 20 MCG/5ML (4 MCG/ML) IV SYRINGE
PREFILLED_SYRINGE | INTRAVENOUS | Status: DC | PRN
  Administered 2020-07-12: 8 ug via INTRAVENOUS
  Administered 2020-07-12: 4 ug via INTRAVENOUS
  Administered 2020-07-12: 8 ug via INTRAVENOUS

## 2020-07-12 MED ORDER — COCONUT OIL OIL
1.0000 "application " | TOPICAL_OIL | Status: DC | PRN
  Administered 2020-07-14: 1 via TOPICAL

## 2020-07-12 MED ORDER — SCOPOLAMINE 1 MG/3DAYS TD PT72: 1.0000 | MEDICATED_PATCH | Freq: Once | TRANSDERMAL | Status: DC

## 2020-07-12 MED ORDER — OXYTOCIN-SODIUM CHLORIDE 30-0.9 UT/500ML-% IV SOLN
INTRAVENOUS | Status: AC
  Filled 2020-07-12: qty 500

## 2020-07-12 MED ORDER — METOCLOPRAMIDE HCL 5 MG/ML IJ SOLN
INTRAMUSCULAR | Status: DC | PRN
  Administered 2020-07-12: 10 mg via INTRAVENOUS

## 2020-07-12 MED ORDER — ONDANSETRON HCL 4 MG/2ML IJ SOLN
INTRAMUSCULAR | Status: DC | PRN
  Administered 2020-07-12: 4 mg via INTRAVENOUS

## 2020-07-12 MED ORDER — HYDROMORPHONE HCL 1 MG/ML IJ SOLN: 0.2500 mg | INTRAMUSCULAR | Status: DC | PRN

## 2020-07-12 MED ORDER — SIMETHICONE 80 MG PO CHEW: 80.0000 mg | CHEWABLE_TABLET | ORAL | Status: DC | PRN

## 2020-07-12 MED ORDER — SODIUM CHLORIDE (PF) 0.9 % IJ SOLN
INTRAMUSCULAR | Status: AC
  Filled 2020-07-12: qty 10

## 2020-07-12 MED ORDER — SENNOSIDES-DOCUSATE SODIUM 8.6-50 MG PO TABS
2.0000 | ORAL_TABLET | ORAL | Status: DC
  Administered 2020-07-13: 2 via ORAL
  Filled 2020-07-12: qty 2

## 2020-07-12 MED ORDER — LACTATED RINGERS IV SOLN: INTRAVENOUS | Status: DC | PRN

## 2020-07-12 MED ORDER — MEPERIDINE HCL 25 MG/ML IJ SOLN: 6.2500 mg | INTRAMUSCULAR | Status: DC | PRN

## 2020-07-12 MED ORDER — MORPHINE SULFATE (PF) 0.5 MG/ML IJ SOLN
INTRAMUSCULAR | Status: AC
  Filled 2020-07-12: qty 10

## 2020-07-12 MED ORDER — DIBUCAINE (PERIANAL) 1 % EX OINT: 1.0000 "application " | TOPICAL_OINTMENT | CUTANEOUS | Status: DC | PRN

## 2020-07-12 MED ORDER — PROMETHAZINE HCL 25 MG/ML IJ SOLN: 6.2500 mg | INTRAMUSCULAR | Status: DC | PRN

## 2020-07-12 MED ORDER — LEVONORGESTREL 19.5 MCG/DAY IU IUD
INTRAUTERINE_SYSTEM | Freq: Once | INTRAUTERINE | Status: AC
  Administered 2020-07-12: 1 via INTRAUTERINE

## 2020-07-12 MED ORDER — NALOXONE HCL 4 MG/10ML IJ SOLN
1.0000 ug/kg/h | INTRAVENOUS | Status: DC | PRN
  Filled 2020-07-12: qty 5

## 2020-07-12 MED ORDER — DEXAMETHASONE SODIUM PHOSPHATE 4 MG/ML IJ SOLN
INTRAMUSCULAR | Status: DC | PRN
  Administered 2020-07-12: 8 mg via INTRAVENOUS

## 2020-07-12 MED ORDER — LACTATED RINGERS IV SOLN: INTRAVENOUS | Status: DC

## 2020-07-12 MED ORDER — TRANEXAMIC ACID-NACL 1000-0.7 MG/100ML-% IV SOLN
INTRAVENOUS | Status: DC | PRN
  Administered 2020-07-12: 1000 mg via INTRAVENOUS

## 2020-07-12 MED ORDER — METOCLOPRAMIDE HCL 5 MG/ML IJ SOLN
INTRAMUSCULAR | Status: AC
  Filled 2020-07-12: qty 2

## 2020-07-12 MED ORDER — SODIUM CHLORIDE 0.9 % IV SOLN
INTRAVENOUS | Status: AC
  Filled 2020-07-12: qty 500

## 2020-07-12 MED ORDER — SODIUM CHLORIDE 0.9% FLUSH: 3.0000 mL | INTRAVENOUS | Status: DC | PRN

## 2020-07-12 MED ORDER — HYDROCODONE-ACETAMINOPHEN 5-325 MG PO TABS
1.0000 | ORAL_TABLET | ORAL | Status: DC | PRN
  Administered 2020-07-13 (×2): 2 via ORAL
  Filled 2020-07-12 (×2): qty 2

## 2020-07-12 MED ORDER — SIMETHICONE 80 MG PO CHEW
80.0000 mg | CHEWABLE_TABLET | Freq: Three times a day (TID) | ORAL | Status: DC
  Administered 2020-07-12 – 2020-07-14 (×6): 80 mg via ORAL
  Filled 2020-07-12 (×6): qty 1

## 2020-07-12 MED ORDER — NALBUPHINE HCL 10 MG/ML IJ SOLN: 5.0000 mg | INTRAMUSCULAR | Status: DC | PRN

## 2020-07-12 MED ORDER — OXYTOCIN-SODIUM CHLORIDE 30-0.9 UT/500ML-% IV SOLN
INTRAVENOUS | Status: DC | PRN
  Administered 2020-07-12: 500 mL via INTRAVENOUS

## 2020-07-12 MED ORDER — PRENATAL MULTIVITAMIN CH
1.0000 | ORAL_TABLET | Freq: Every day | ORAL | Status: DC
  Administered 2020-07-13: 1 via ORAL
  Filled 2020-07-12: qty 1

## 2020-07-12 MED ORDER — ONDANSETRON HCL 4 MG/2ML IJ SOLN: 4.0000 mg | Freq: Three times a day (TID) | INTRAMUSCULAR | Status: DC | PRN

## 2020-07-12 MED ORDER — KETOROLAC TROMETHAMINE 30 MG/ML IJ SOLN
30.0000 mg | Freq: Four times a day (QID) | INTRAMUSCULAR | Status: DC | PRN
  Administered 2020-07-12 (×2): 30 mg via INTRAVENOUS

## 2020-07-12 MED ORDER — ENOXAPARIN SODIUM 60 MG/0.6ML IJ SOSY
60.0000 mg | PREFILLED_SYRINGE | INTRAMUSCULAR | Status: DC
  Administered 2020-07-13: 60 mg via SUBCUTANEOUS
  Filled 2020-07-12: qty 0.6

## 2020-07-12 MED ORDER — IBUPROFEN 600 MG PO TABS: 600.0000 mg | ORAL_TABLET | Freq: Four times a day (QID) | ORAL | Status: DC

## 2020-07-12 MED ORDER — MORPHINE SULFATE (PF) 0.5 MG/ML IJ SOLN
INTRAMUSCULAR | Status: DC | PRN
  Administered 2020-07-12: 150 ug via INTRATHECAL

## 2020-07-12 MED ORDER — KETOROLAC TROMETHAMINE 30 MG/ML IJ SOLN
INTRAMUSCULAR | Status: AC
  Filled 2020-07-12: qty 1

## 2020-07-12 MED ORDER — OXYTOCIN-SODIUM CHLORIDE 30-0.9 UT/500ML-% IV SOLN: 2.5000 [IU]/h | INTRAVENOUS | Status: AC

## 2020-07-12 MED ORDER — DEXAMETHASONE SODIUM PHOSPHATE 4 MG/ML IJ SOLN
INTRAMUSCULAR | Status: AC
  Filled 2020-07-12: qty 2

## 2020-07-12 MED ORDER — FENTANYL CITRATE (PF) 100 MCG/2ML IJ SOLN
INTRAMUSCULAR | Status: AC
  Filled 2020-07-12: qty 2

## 2020-07-12 MED ORDER — CEFAZOLIN SODIUM-DEXTROSE 2-4 GM/100ML-% IV SOLN
2.0000 g | INTRAVENOUS | Status: DC
  Filled 2020-07-12: qty 100

## 2020-07-12 MED ORDER — NALBUPHINE HCL 10 MG/ML IJ SOLN: 5.0000 mg | Freq: Once | INTRAMUSCULAR | Status: DC | PRN

## 2020-07-12 MED ORDER — PHENYLEPHRINE HCL-NACL 20-0.9 MG/250ML-% IV SOLN
INTRAVENOUS | Status: AC
  Filled 2020-07-12: qty 250

## 2020-07-12 MED ORDER — DIPHENHYDRAMINE HCL 25 MG PO CAPS: 25.0000 mg | ORAL_CAPSULE | Freq: Four times a day (QID) | ORAL | Status: DC | PRN

## 2020-07-12 MED ORDER — NALOXONE HCL 0.4 MG/ML IJ SOLN: 0.4000 mg | INTRAMUSCULAR | Status: DC | PRN

## 2020-07-12 MED ORDER — SODIUM CHLORIDE 0.9 % IR SOLN
Status: DC | PRN
  Administered 2020-07-12 (×2): 1

## 2020-07-12 MED ORDER — KETOROLAC TROMETHAMINE 30 MG/ML IJ SOLN: 30.0000 mg | Freq: Four times a day (QID) | INTRAMUSCULAR | Status: DC | PRN

## 2020-07-12 MED ORDER — OXYCODONE HCL 5 MG PO TABS: 5.0000 mg | ORAL_TABLET | Freq: Once | ORAL | Status: DC | PRN

## 2020-07-12 MED ORDER — SCOPOLAMINE 1 MG/3DAYS TD PT72
MEDICATED_PATCH | TRANSDERMAL | Status: DC | PRN
  Administered 2020-07-12: 1 via TRANSDERMAL

## 2020-07-12 MED ORDER — FENTANYL CITRATE (PF) 100 MCG/2ML IJ SOLN
INTRAMUSCULAR | Status: DC | PRN
  Administered 2020-07-12: 15 ug via INTRATHECAL

## 2020-07-12 MED ORDER — WITCH HAZEL-GLYCERIN EX PADS: 1.0000 "application " | MEDICATED_PAD | CUTANEOUS | Status: DC | PRN

## 2020-07-12 MED ORDER — KETOROLAC TROMETHAMINE 30 MG/ML IJ SOLN: 30.0000 mg | Freq: Once | INTRAMUSCULAR | Status: DC | PRN

## 2020-07-12 MED ORDER — KETOROLAC TROMETHAMINE 30 MG/ML IJ SOLN
30.0000 mg | Freq: Four times a day (QID) | INTRAMUSCULAR | Status: DC
  Administered 2020-07-12 – 2020-07-13 (×2): 30 mg via INTRAVENOUS
  Filled 2020-07-12 (×3): qty 1

## 2020-07-12 MED ORDER — MENTHOL 3 MG MT LOZG: 1.0000 | LOZENGE | OROMUCOSAL | Status: DC | PRN

## 2020-07-12 MED ORDER — CEFAZOLIN SODIUM-DEXTROSE 2-3 GM-%(50ML) IV SOLR
INTRAVENOUS | Status: DC | PRN
  Administered 2020-07-12: 2 g via INTRAVENOUS

## 2020-07-12 MED ORDER — PHENYLEPHRINE 40 MCG/ML (10ML) SYRINGE FOR IV PUSH (FOR BLOOD PRESSURE SUPPORT)
PREFILLED_SYRINGE | INTRAVENOUS | Status: AC
  Filled 2020-07-12: qty 10

## 2020-07-12 MED ORDER — PROMETHAZINE HCL 25 MG/ML IJ SOLN
INTRAMUSCULAR | Status: AC
  Filled 2020-07-12: qty 1

## 2020-07-12 MED ORDER — PHENYLEPHRINE HCL-NACL 20-0.9 MG/250ML-% IV SOLN
INTRAVENOUS | Status: DC | PRN
  Administered 2020-07-12: 60 ug/min via INTRAVENOUS

## 2020-07-12 MED ORDER — SODIUM CHLORIDE 0.9 % IV SOLN
500.0000 mg | INTRAVENOUS | Status: AC
  Administered 2020-07-12: 500 mg via INTRAVENOUS

## 2020-07-12 MED ORDER — LEVONORGESTREL 20.1 MCG/DAY IU IUD
INTRAUTERINE_SYSTEM | INTRAUTERINE | Status: AC
  Filled 2020-07-12: qty 1

## 2020-07-12 SURGICAL SUPPLY — 37 items
CHLORAPREP W/TINT 26ML (MISCELLANEOUS) ×2 IMPLANT
CLAMP CORD UMBIL (MISCELLANEOUS) IMPLANT
CLOTH BEACON ORANGE TIMEOUT ST (SAFETY) ×2 IMPLANT
DRSG OPSITE POSTOP 4X10 (GAUZE/BANDAGES/DRESSINGS) ×2 IMPLANT
ELECT REM PT RETURN 9FT ADLT (ELECTROSURGICAL) ×2
ELECTRODE REM PT RTRN 9FT ADLT (ELECTROSURGICAL) ×1 IMPLANT
EXTRACTOR VACUUM BELL STYLE (SUCTIONS) IMPLANT
GAUZE SPONGE 4X4 12PLY STRL LF (GAUZE/BANDAGES/DRESSINGS) ×4 IMPLANT
GLOVE BIOGEL PI IND STRL 7.0 (GLOVE) ×2 IMPLANT
GLOVE BIOGEL PI INDICATOR 7.0 (GLOVE) ×2
GLOVE ECLIPSE 7.0 STRL STRAW (GLOVE) ×2 IMPLANT
GOWN STRL REUS W/TWL LRG LVL3 (GOWN DISPOSABLE) ×4 IMPLANT
HEMOSTAT ARISTA ABSORB 3G PWDR (HEMOSTASIS) ×2 IMPLANT
KIT ABG SYR 3ML LUER SLIP (SYRINGE) ×2 IMPLANT
NEEDLE HYPO 25X5/8 SAFETYGLIDE (NEEDLE) ×2 IMPLANT
NS IRRIG 1000ML POUR BTL (IV SOLUTION) ×2 IMPLANT
PACK C SECTION WH (CUSTOM PROCEDURE TRAY) ×2 IMPLANT
PAD ABD 7.5X8 STRL (GAUZE/BANDAGES/DRESSINGS) ×2 IMPLANT
PAD OB MATERNITY 4.3X12.25 (PERSONAL CARE ITEMS) ×2 IMPLANT
PENCIL SMOKE EVAC W/HOLSTER (ELECTROSURGICAL) ×2 IMPLANT
RTRCTR C-SECT PINK 25CM LRG (MISCELLANEOUS) ×2 IMPLANT
SUT MNCRL 0 VIOLET CTX 36 (SUTURE) ×2 IMPLANT
SUT MON AB 3-0 SH 27 (SUTURE) ×2
SUT MON AB 3-0 SH27 (SUTURE) ×2 IMPLANT
SUT MONOCRYL 0 CTX 36 (SUTURE) ×2
SUT PLAIN 0 NONE (SUTURE) IMPLANT
SUT PLAIN 2 0 (SUTURE)
SUT PLAIN 2 0 XLH (SUTURE) ×2 IMPLANT
SUT PLAIN ABS 2-0 CT1 27XMFL (SUTURE) IMPLANT
SUT VIC AB 0 CTX 36 (SUTURE) ×1
SUT VIC AB 0 CTX36XBRD ANBCTRL (SUTURE) ×1 IMPLANT
SUT VIC AB 2-0 CT1 27 (SUTURE) ×1
SUT VIC AB 2-0 CT1 TAPERPNT 27 (SUTURE) ×1 IMPLANT
SUT VIC AB 4-0 KS 27 (SUTURE) ×2 IMPLANT
TOWEL OR 17X24 6PK STRL BLUE (TOWEL DISPOSABLE) ×2 IMPLANT
TRAY FOLEY W/BAG SLVR 14FR LF (SET/KITS/TRAYS/PACK) IMPLANT
WATER STERILE IRR 1000ML POUR (IV SOLUTION) ×2 IMPLANT

## 2020-07-12 NOTE — Telephone Encounter (Signed)
Patient called and left message on nurse voicemail line stating she needs her maternity leave paperwork completed. Patient was wondering if papers could be filled out on Wednesday at her appt. Per chart review, patient currently admitted to the hospital for c-section. Will send mychart message.

## 2020-07-12 NOTE — Transfer of Care (Signed)
Immediate Anesthesia Transfer of Care Note  Patient: Rachel Gregory  Procedure(s) Performed: CESAREAN SECTION (N/A )  Patient Location: PACU  Anesthesia Type:Spinal  Level of Consciousness: awake, alert  and oriented  Airway & Oxygen Therapy: Patient Spontanous Breathing  Post-op Assessment: Report given to RN and Post -op Vital signs reviewed and stable  Post vital signs: Reviewed and stable  Last Vitals:  Vitals Value Taken Time  BP 113/65 07/12/20 1100  Temp    Pulse 71 07/12/20 1102  Resp 12 07/12/20 1102  SpO2 100 % 07/12/20 1102  Vitals shown include unvalidated device data.  Last Pain:  Vitals:   07/12/20 0647  TempSrc:   PainSc: 3       Patients Stated Pain Goal: 0 (07/12/20 0647)  Complications: No complications documented.

## 2020-07-12 NOTE — Anesthesia Procedure Notes (Signed)
Spinal  Patient location during procedure: OR Start time: 07/12/2020 9:15 AM End time: 07/12/2020 9:17 AM Reason for block: surgical anesthesia Staffing Performed: anesthesiologist  Anesthesiologist: Lannie Fields, DO Preanesthetic Checklist Completed: patient identified, IV checked, risks and benefits discussed, surgical consent, monitors and equipment checked, pre-op evaluation and timeout performed Spinal Block Patient position: sitting Prep: DuraPrep and site prepped and draped Patient monitoring: cardiac monitor, continuous pulse ox and blood pressure Approach: midline Location: L3-4 Injection technique: single-shot Needle Needle type: Pencan  Needle gauge: 24 G Needle length: 9 cm Assessment Sensory level: T6 Events: CSF return Additional Notes Functioning IV was confirmed and monitors were applied. Sterile prep and drape, including hand hygiene and sterile gloves were used. The patient was positioned and the spine was prepped. The skin was anesthetized with lidocaine.  Free flow of clear CSF was obtained prior to injecting local anesthetic into the CSF.  The spinal needle aspirated freely following injection.  The needle was carefully withdrawn.  The patient tolerated the procedure well.

## 2020-07-12 NOTE — H&P (Signed)
OBSTETRIC ADMISSION HISTORY AND PHYSICAL  Rachel Gregory is a 30 y.o. female G31P1011 with IUP at [redacted]w[redacted]d by LMP presenting for PROM at 0500 this morning. Pt strongly desires elective repeat Cesarean. She reports +FMs, no VB, no blurry vision, headaches or peripheral edema, and RUQ pain.  She plans on breast feeding. She requests POPs for birth control. 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 ADHD (attention deficit hyperactivity disorder)   . Allergy   . Asthma   . Bipolar 1 disorder ([place])   . Depression   . Endometriosis   . Plantar fasciitis of right foot     Past Surgical History: Past Surgical History:  Procedure Laterality Date  . BIOPSY ENDOMETRIAL    . CESAREAN SECTION N/A 01/16/2017   Procedure: CESAREAN SECTION;  Surgeon: Truett Mainland, DO;  Location: [place];  Service: Obstetrics;  Laterality: N/A;  . COLPOSCOPY  02/18/2020      . COLPOSCOPY  05/24/2020      . DILATION AND CURETTAGE OF UTERUS  04/2015   spontaneous abortion    Obstetrical History: OB History    Gravida  3   Para  1   Term  1   Preterm      AB  1   Living  1     SAB  1   IAB      Ectopic      Multiple  0   Live Births  1           Social History Social History   Socioeconomic History  . Marital status: Single    Spouse name: Not on file  . Number of children: Not on file  . Years of education: Not on file  . Highest education level: Not on file  Occupational History  . Not on file  Tobacco Use  . Smoking status:  Current Every Day Smoker    Packs/day: 0.25    Types: Cigarettes  . Smokeless tobacco: Never Used  Vaping Use  . Vaping Use: Former  . Substances: Nicotine  . Devices: only tried one day and it made her cough  Substance and Sexual Activity  . Alcohol use: Not Currently    Comment: occ not since pregnant  . Drug use: No  . Sexual activity: Yes    Birth control/protection: None  Other Topics Concern  . Not on file  Social History Narrative  . Not on file   Social Determinants of Health   Financial Resource Strain: Not on file  Food Insecurity: No Food Insecurity  . Worried About Charity fundraiser in the Last Year: Never true  . Ran Out of Food in the Last Year: Never true  Transportation Needs: No Transportation Needs  . Lack of Transportation (Medical): No  . Lack of Transportation (Non-Medical): No  Physical Activity: Not on file  Stress: Not on file  Social Connections: Not on file    Family History: Family History  Problem Relation Age of Onset  . Miscarriages / [place] Mother   . Ovarian cancer Mother   .  Diabetes Mother   . Arthritis Father   . Miscarriages / Stillbirths Maternal Aunt   . Birth defects Maternal Uncle        congenital heart defect  . Birth defects Paternal Uncle        spina bifida  . Miscarriages / Stillbirths Maternal Grandmother   . Miscarriages / Stillbirths Paternal Grandmother   . Breast cancer Paternal Grandmother     Allergies: Allergies  Allergen Reactions  . Bee Venom Hives and Swelling    Medications Prior to Admission  Medication Sig Dispense Refill Last Dose  . Prenatal Vit-Fe Fumarate-FA (PRENATAL VITAMIN PO) Take 1 tablet by mouth daily. Romona Curls   07/11/2020 at Unknown time  . Blood Pressure Monitoring (BLOOD PRESSURE KIT) DEVI 1 Device by Does not apply route as needed. (Patient not taking: No sig reported) 1 each 0   . ondansetron (ZOFRAN ODT) 4 MG disintegrating tablet Take 1-2 tablets (4-8 mg total) by mouth  every 8 (eight) hours as needed for nausea or vomiting. (Patient not taking: No sig reported) 30 tablet 1   . promethazine (PHENERGAN) 25 MG tablet Take 1 tablet (25 mg total) by mouth every 6 (six) hours as needed for nausea or vomiting. (Patient not taking: No sig reported) 30 tablet 1      Review of Systems   All systems reviewed and negative except as stated in HPI  Blood pressure 118/63, pulse 62, temperature 98.6 F (37 C), temperature source Oral, resp. rate 17, height $RemoveBe'5\' 6"'BmotPczej$  (1.676 m), weight 118 kg, last menstrual period 10/23/2019, SpO2 100 %. General appearance: alert, cooperative and appears stated age Lungs: normal WOB Heart: regular rate Abdomen: soft, non-tender Extremities: no sign of DVT Presentation: cephalic Fetal monitoringBaseline: 120 bpm, Variability: Good {> 6 bpm), Accelerations: Reactive and Decelerations: Absent Uterine activityFrequency: Every 3-4 minutes Dilation: Closed Effacement (%): Thick Exam by:: Nelva Bush, RN  Prenatal labs: ABO, Rh: --/--/A POS (05/02 5170) Antibody: NEG (05/02 0174) Rubella: 2.81 (11/01 1518) RPR: Non Reactive (02/16 0852)  HBsAg: Negative (11/01 1518)  HIV: Non Reactive (02/16 0852)  GBS: Positive/-- (04/20 1320)  2 hr Glucola wnl Genetic screening wnl except for 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 Anatomy US   Prenatal Transfer Tool  Maternal Diabetes: No Genetic Screening: wnl except for 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 Maternal Ultrasounds/Referrals: Normal except for prominent ductal bump Fetal Ultrasounds or other Referrals:  None Maternal Substance Abuse:  Yes:  Type: Smoker Significant Maternal Medications:  None Significant Maternal Lab Results: Group B Strep positive  Results for orders placed or performed during the hospital encounter of 07/12/20 (from the past 24 hour(s))  Resp Panel by RT-PCR (Flu A&B, Covid) Nasopharyngeal Swab   Collection Time: 07/12/20  6:55 AM   Specimen: Nasopharyngeal  Swab; Nasopharyngeal(NP) swabs in vial transport medium  Result Value Ref Range   SARS Coronavirus 2 by RT PCR NEGATIVE NEGATIVE   Influenza A by PCR NEGATIVE NEGATIVE   Influenza B by PCR NEGATIVE NEGATIVE  CBC   Collection Time: 07/12/20  6:57 AM  Result Value Ref Range   WBC 10.8 (H) 4.0 - 10.5 K/uL   RBC 4.78 3.87 - 5.11 MIL/uL   Hemoglobin 13.1 12.0 - 15.0 g/dL   HCT 41.5 36.0 - 46.0 %   MCV 86.8 80.0 - 100.0 fL   MCH 27.4 26.0 - 34.0 pg   MCHC 31.6 30.0 - 36.0 g/dL   RDW 14.7 11.5 - 15.5 %   Platelets 157 150 -  400 K/uL   nRBC 0.0 0.0 - 0.2 %  Type and screen [place]   Collection Time: 07/12/20  7:02 AM  Result Value Ref Range   ABO/RH(D) A POS    Antibody Screen NEG    Sample Expiration      07/15/2020,2359 Performed at [facility], [place] [address] [place], [place] [address]   POCT fern test   Collection Time: 07/12/20  7:15 AM  Result Value Ref Range   POCT Fern Test Positive = ruptured amniotic membanes     Patient Active Problem List   Diagnosis Date Noted  . GBS (group B Streptococcus carrier), +RV culture, currently pregnant 07/05/2020  . Benign gestational thrombocytopenia, antepartum ([place]) 02/16/2020  . Carrier of genetic disorder 01/31/2020  . Cervical dysplasia 01/16/2020  . Supervision of high risk pregnancy, antepartum 12/29/2019  . Obesity in pregnancy 12/29/2019  . Previous cesarean delivery affecting pregnancy, antepartum 01/16/2017  . Tobacco abuse 06/15/2016  . Obesity (BMI 30-39.9) 06/15/2016  . Endometriosis 05/24/2016    Assessment/Plan:  RUDI KNIPPENBERG is a 30 y.o. G3P1011 at [redacted]w[redacted]d here for PROM at 0500 this morning. Pt strongly desires an elective repeat Cesarean.  #Elective Repeat Cesarean s/p PROM: H/o Cesarean x1 for fetal intolerance. The risks of cesarean section were discussed with the patient including but were not limited to: bleeding which may require transfusion or reoperation; infection which may  require antibiotics; injury to bowel, bladder, ureters or other surrounding organs; injury to the fetus; need for additional procedures including hysterectomy in the event of a life-threatening hemorrhage; placental abnormalities wth subsequent pregnancies, incisional problems, thromboembolic phenomenon and other postoperative/anesthesia complications.  The patient concurred with the proposed plan, giving informed written consent for the procedure.  Patient has been NPO since last night; she will remain NPO for procedure. Anesthesia and OR aware.  Preoperative prophylactic antibiotics and SCDs ordered on call to the OR. To OR when ready.  #Pain: Per anesthesia #FWB: Category 1 strip #ID: GBS positive; plan for ancef and azithromycin prior to OR #MOF: breast #MOC: POPs #Circ: desired #Gestational Thrombocytopenia: f/u CBC on admission #Tobacco use: pt declines nicotine patch. Praised pt for efforts to cut back and encouraged complete cessation.  Randa Ngo, MD OB Fellow, [facility] 07/12/2020 9:03 AM

## 2020-07-12 NOTE — Anesthesia Preprocedure Evaluation (Addendum)
Anesthesia Evaluation  Patient identified by MRN, date of birth, ID band Patient awake    Reviewed: Allergy & Precautions, NPO status , Patient's Chart, lab work & pertinent test results  Airway Mallampati: III  TM Distance: >3 FB Neck ROM: Full    Dental no notable dental hx. (+) Teeth Intact, Dental Advisory Given, Chipped,    Pulmonary asthma , Current Smoker,  3-4 cigg/d Childhood asthma    Pulmonary exam normal breath sounds clear to auscultation       Cardiovascular negative cardio ROS Normal cardiovascular exam Rhythm:Regular Rate:Normal     Neuro/Psych PSYCHIATRIC DISORDERS Anxiety Depression Bipolar Disorder negative neurological ROS     GI/Hepatic negative GI ROS, Neg liver ROS,   Endo/Other  Morbid obesityBMI 42  Renal/GU negative Renal ROS  Female GU complaint     Musculoskeletal negative musculoskeletal ROS (+)   Abdominal (+) + obese,   Peds negative pediatric ROS (+)  Hematology  (+) Blood dyscrasia, , Gestational thrombocytopenia hct 41.5, plt 157   Anesthesia Other Findings   Reproductive/Obstetrics (+) Pregnancy Repeat section, 1st one under epidural Presented ruptured to MAU. Not dilated  Pt extremely anxious, had "bad experience" last time because she felt more than she was hoping for, vomited too much throughout the procedure                         Anesthesia Physical Anesthesia Plan  ASA: III and emergent  Anesthesia Plan: Spinal   Post-op Pain Management:    Induction:   PONV Risk Score and Plan: 2 and Ondansetron, Dexamethasone and Treatment may vary due to age or medical condition  Airway Management Planned: Natural Airway  Additional Equipment: None  Intra-op Plan:   Post-operative Plan:   Informed Consent: I have reviewed the patients History and Physical, chart, labs and discussed the procedure including the risks, benefits and alternatives  for the proposed anesthesia with the patient or authorized representative who has indicated his/her understanding and acceptance.       Plan Discussed with: CRNA  Anesthesia Plan Comments:         Anesthesia Quick Evaluation

## 2020-07-12 NOTE — Lactation Note (Signed)
This note was copied from a baby's chart. Lactation Consultation Note  Patient Name: Rachel Gregory Date: 07/12/2020 Reason for consult: Initial assessment;Mother's request;Difficult latch;Early term 45-38.6wks Age: 28 hrs Mom's nipples are flat will erect some with stimulation, left more than right. LC attempted latch in football, cross cradle and laid back nursing but infant not able to sustain it.  Mom pumped on DEBP 10 minutes, drops of colostrum provided via finger feeding.  LC able to sustain the latch with use of 20 NS on the right breast for 10 minutes with signs of milk transfer.   Infant able to extend tongue pass the gumline.  Mom stated size 24 flange was comfortable fit after 10 minutes of pumping.   Mom given breast shells to wear to help extend her nipples. Mom to wear them when she is not pumping, sleeping or resting. Parts, assembly and cleaning reviewed.   LC reviewed LPTI guidelines given infant gestational age, birthweight and latch score (latch 6).  Mom aware to keep total feeding under 30 minutes, keep infant hat on all times, and not too many visitors.    Plan 1. To feed based on cues 8-12 x in 24 hr period, no more than 3 hrs without an attempt. Mom to offer both breasts and if needed apply 20 NS. 2. If unable to latch, Mom to offer EBM via spoon or finger feeding.  3. Mom pump with DEBP q 3 hrs for 15 minutes.  4. I and O sheet reviewed.  5. LC brochure of inpatient and outpatient services reviewed.  All questions answered at the end of the visit.      Maternal Data Has patient been taught Hand Expression?: Yes Does the patient have breastfeeding experience prior to this delivery?: Yes How long did the patient breastfeed?: 1 month  Feeding Mother's Current Feeding Choice: Breast Milk  LATCH Score Latch: Repeated attempts needed to sustain latch, nipple held in mouth throughout feeding, stimulation needed to elicit sucking reflex.  Audible  Swallowing: A few with stimulation  Type of Nipple: Flat  Comfort (Breast/Nipple): Soft / non-tender  Hold (Positioning): Assistance needed to correctly position infant at breast and maintain latch.  LATCH Score: 6   Lactation Tools Discussed/Used Tools: Nipple Dorris Carnes;Flanges;Pump Nipple shield size: 20 Flange Size: 24 Breast pump type: Double-Electric Breast Pump Pump Education: Setup, frequency, and cleaning;Milk Storage Reason for Pumping: increase stimulation Pumping frequency: every 3 hrs for 15 minutes  Interventions Interventions: Breast feeding basics reviewed;Support pillows;Education;Assisted with latch;Position options;Skin to skin;Expressed milk;Breast massage;Hand express;Shells;DEBP;Breast compression;Adjust position  Discharge Pump: Personal WIC Program: Yes  Consult Status Consult Status: Follow-up Date: 07/13/20 Follow-up type: In-patient    Rachel Gregory  Rachel Gregory 07/12/2020, 5:13 PM

## 2020-07-12 NOTE — Op Note (Signed)
Operative Note   Patient: Rachel Gregory  Date of Procedure: 07/12/2020  Procedure: Repeat Low Transverse Cesarean   Indications: previous uterine incision: low transverse  Pre-operative Diagnosis: rpt c/s ROM.   Post-operative Diagnosis: Same and IUD insertion: Liletta  TOLAC Candidate: No  Surgeon: Surgeon(s) and Role:    * Donny Heffern, Mary Sella, MD - Primary    * Goswick, Skipper Cliche, MD - Assisting  Assistants: none  An experienced assistant was required given the standard of surgical care given the complexity of the case.  This assistant was needed for exposure, dissection, suctioning, retraction, instrument exchange, assisting with delivery with administration of fundal pressure, and for overall help during the procedure.   Anesthesia: spinal  Anesthesiologist: No responsible provider has been recorded for the case.   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 U2P5361 with an IUP [redacted]w[redacted]d presenting for unscheduled, urgent cesarean secondary to the indications listed above. Clinical course notable for presentation to MAU after SROM around 0500. Patient cervix was closed and she was not laboring at the time of cesarean. On admission patient opted for post-placental IUD for contraception.  The risks of cesarean section discussed with the patient included but were not limited to: bleeding which may require transfusion or reoperation; infection which may require antibiotics; injury to bowel, bladder, ureters or other surrounding organs; injury to the fetus; need for additional procedures including hysterectomy in the event of a life-threatening hemorrhage; placental abnormalities with  subsequent pregnancies, incisional problems, thromboembolic phenomenon and other postoperative/anesthesia complications. The patient concurred with the proposed plan, giving informed written consent for the procedure. Patient has been NPO since last night she will remain NPO for procedure. Anesthesia and OR aware. Preoperative prophylactic antibiotics and SCDs ordered on call to the OR.   Findings: Viable infant in cephalic, ROP presentation, no nuchal cord present. Apgars 8, 8. Weight 2860 g . Clear amniotic fluid. Normal placenta, three vessel cord. Normal uterus, Normal bilateral fallopian tubes, Normal bilateral ovaries.  Procedure Details: A Time Out was held and the above information confirmed. The patient received intravenous antibiotics and had sequential compression devices applied to her lower extremities preoperatively. The patient was taken back to the operative suite where spinal anesthesia was administered. After induction of anesthesia, the patient was draped and prepped in the usual sterile manner and placed in a dorsal supine position with a leftward tilt. A low transverse incision was made with scalpel and carried down through the subcutaneous tissue to the fascia. Fascial incision was made and extended transversely. The fascia was separated from the underlying rectus tissue superiorly and inferiorly. The rectus muscles were separated in the midline bluntly and the peritoneum was entered bluntly. Immediately upon entering the abdominal cavity there was a significant amount of bowel present. Attempts were made to place the Brimley several times but we were unable to place it free of bowel and so three moist laps were placed to retract the bowel and a large Rich and bladder blade were inserted. A Maylard incision was also made on the L rectus muscle using bovie. A bladder flap was not developed. A low transverse incision was made well above the bladder reflection. The infant was successfully  delivered from cephalic, ROP presentation, the umbilical cord was clamped after 1 minute. Cord ph was not sent, and cord blood was obtained for evaluation. The placenta was removed Intact and appeared normal. The uterus was then exteriorized.   A Liletta IUD was then removed from it's packaging in a sterile manner and the strings were trimmed to approximately 10 cm. The IUD was placed manually at the uterine fundus and the strings were passed through the cervical os with a Kelly clamp.   The uterine incision was closed with running locked sutures of 0-Monocryl. An inferior extension was noted on the R side of the hysterotomy which was both quite lateral and close to the bladder reflection. The defect was repaired with 3-0 monocryl on a SH needle with good hemostasis and with care taken to avoid the bladder reflection which was more medial to the defect. A second imbricating layer was also placed with 0-Monocryl, during which a small uterine defect was created. This was repaired with 3-0 monocryl with excellent hemostasis. Overall, excellent hemostasis was noted with no evidence of hematoma or expansion in the R broad ligament or the posterior portion of the uterus after several minutes of observation. Urine was also clear so I did not feel that the bladder needed to be backfilled at that time. The abdomen and the pelvis were cleared of all clot and debris, and irrigation was performed. Hemostasis was confirmed on all surfaces and a layer of Arista was placed.  The peritoneum was was not reapproximated, due to signifiant adhesions to the rectus, however the rectus muscles were loosely re-approximated using 2-0 vicryl. The fascia was then closed using 0 Vicryl in a running fashion. The subcutaneous layer was reapproximated with plain gut and the skin was closed with a 4-0 vicryl subcuticular stitch. The patient tolerated the procedure well. Sponge, lap, instrument and needle counts were correct x 2. She was taken  to the recovery room in stable condition.  Disposition: PACU - hemodynamically stable.    Signed: Venora Maples, MD, MPH [facility])

## 2020-07-12 NOTE — MAU Note (Signed)
..  Rachel Gregory is a 30 y.o. at [redacted]w[redacted]d here in MAU reporting: A gush of clear fluid around 5am. Reports contractions that began around 30 minutes ago. +FM

## 2020-07-12 NOTE — Anesthesia Postprocedure Evaluation (Signed)
Anesthesia Post Note  Patient: Rachel Gregory  Procedure(s) Performed: CESAREAN SECTION (N/A )     Patient location during evaluation: PACU Anesthesia Type: Spinal Level of consciousness: awake and alert and oriented Pain management: pain level controlled Vital Signs Assessment: post-procedure vital signs reviewed and stable Respiratory status: spontaneous breathing, nonlabored ventilation and respiratory function stable Cardiovascular status: blood pressure returned to baseline and stable Postop Assessment: no headache, no backache, spinal receding, patient able to bend at knees and no apparent nausea or vomiting Anesthetic complications: no   No complications documented.  Last Vitals:  Vitals:   07/12/20 1145 07/12/20 1200  BP: (!) 96/57 (!) 121/58  Pulse: (!) 58   Resp: 13 19  Temp:  36.7 C  SpO2: 99%     Last Pain:  Vitals:   07/12/20 1200  TempSrc: Oral  PainSc: 3    Pain Goal: Patients Stated Pain Goal: 0 (07/12/20 3557)              Epidural/Spinal Function Cutaneous sensation: Able to Wiggle Toes (07/12/20 1200), Patient able to flex knees: Yes (07/12/20 1200), Patient able to lift hips off bed: No (07/12/20 1200), Back pain beyond tenderness at insertion site: No (07/12/20 1200), Progressively worsening motor and/or sensory loss: No (07/12/20 1200), Bowel and/or bladder incontinence post epidural: No (07/12/20 1200)  Lannie Fields

## 2020-07-12 NOTE — Discharge Summary (Signed)
Postpartum Discharge Summary     Patient Name: Rachel Gregory DOB: 1991-01-12 MRN: [id number]  Date of admission: 07/12/2020 Delivery date:07/12/2020  Delivering provider: Clarnce Flock  Date of discharge: 07/12/2020  Admitting diagnosis: Supervision of high risk pregnancy, antepartum [O09.90] Intrauterine pregnancy: [redacted]w[redacted]d    Secondary diagnosis:  Active Problems:   Endometriosis   Previous cesarean delivery affecting pregnancy, antepartum   Supervision of high risk pregnancy, antepartum   Obesity in pregnancy   Cervical dysplasia   Benign gestational thrombocytopenia, [place]   GBS (group B Streptococcus carrier), +RV culture, currently pregnant   IUD (intrauterine device) in place  Additional problems: none    Discharge diagnosis: Term Pregnancy Delivered                                              Post partum procedures:post-placental Liletta IUD insertion Augmentation: N/A Complications: None  Hospital course: Sceduled C/S   30y.o. yo [id number] admitted to the hospital 07/12/2020 for scheduled cesarean section with the following indication:Elective Repeat and premature rupture of membranes.Delivery details are as follows:  Membrane Rupture Time/Date:  ,   Delivery Method:C-Section, Low Transverse  Details of operation can be found in separate operative note.  Patient had an uncomplicated postpartum course.  She is ambulating, tolerating a regular diet, passing flatus, and urinating well. Patient is discharged home in stable condition on  07/14/20        Newborn Data: Birth date:07/12/2020  Birth time:9:45 AM  Gender:Female  Living status:Living  Apgars:8 ,8  Weight:2860 g     Magnesium Sulfate received: No BMZ received: No Rhophylac:N/A MMR:N/A T-DaP:Given prenatally Flu: No Transfusion:No  Physical exam  Vitals:   07/12/20 0646 07/12/20 0700 07/12/20 1100  BP: 118/63  113/65  Pulse: 62  64  Resp: 17  13  Temp: 98.6 F (37 C)  98.1 F  (36.7 C)  TempSrc: Oral  Oral  SpO2: 99% 100% 99%  Weight: 118 kg    Height: '5\' 6"'  (1.676 m)     General: alert, cooperative and no distress Lochia: appropriate Uterine Fundus: firm Incision: Dressing is clean, dry, and intact DVT Evaluation: No evidence of DVT seen on physical exam. Pre-e assessment: asymptomatic  Labs: Lab Results  Component Value Date   WBC 10.8 (H) 07/12/2020   HGB 13.1 07/12/2020   HCT 41.5 07/12/2020   MCV 86.8 07/12/2020   PLT 157 07/12/2020   CMP Latest Ref Rng & Units 09/04/2019  Glucose 70 - 99 mg/dL 151(H)  BUN 6 - 20 mg/dL 8  Creatinine 0.44 - 1.00 mg/dL 0.75  Sodium 135 - 145 mmol/L 137  Potassium 3.5 - 5.1 mmol/L 4.5  Chloride 98 - 111 mmol/L 104  CO2 22 - 32 mmol/L 26  Calcium 8.9 - 10.3 mg/dL 9.3  Total Protein 6.5 - 8.1 g/dL 7.5  Total Bilirubin 0.3 - 1.2 mg/dL 0.6  Alkaline Phos 38 - 126 U/L 51  AST 15 - 41 U/L 17  ALT 0 - 44 U/L 19   Edinburgh Score: Edinburgh Postnatal Depression Scale Screening Tool 03/27/2017  I have been able to laugh and see the funny side of things. 0  I have looked forward with enjoyment to things. 0  I have blamed myself unnecessarily when things went wrong. 0  I have been anxious or worried for no  good reason. 0  I have felt scared or panicky for no good reason. 0  Things have been getting on top of me. 0  I have been so unhappy that I have had difficulty sleeping. 0  I have felt sad or miserable. 0  I have been so unhappy that I have been crying. 0  The thought of harming myself has occurred to me. 0  Edinburgh Postnatal Depression Scale Total 0    After visit meds:  Allergies as of 07/14/2020      Reactions   Bee Venom Hives, Swelling      Medication List    STOP taking these medications   Blood Pressure Kit Devi   ondansetron 4 MG disintegrating tablet Commonly known as: Zofran ODT   promethazine 25 MG tablet Commonly known as: PHENERGAN     TAKE these medications   acetaminophen 500  MG tablet Commonly known as: TYLENOL Take 2 tablets (1,000 mg total) by mouth every 6 (six) hours as needed for mild pain, moderate pain, fever or headache.   coconut oil Oil Apply 1 application topically as needed (nipple pain).   ibuprofen 800 MG tablet Commonly known as: ADVIL Take 1 tablet (800 mg total) by mouth every 8 (eight) hours as needed.   oxyCODONE 5 MG immediate release tablet Commonly known as: Oxy IR/ROXICODONE Take 1-2 tablets (5-10 mg total) by mouth every 6 (six) hours as needed for severe pain or breakthrough pain.   PRENATAL VITAMIN PO Take 1 tablet by mouth every evening. Romona Curls      Discharge home in stable condition Infant Feeding: Breast Infant Disposition:home with mother Discharge instruction: per After Visit Summary and Postpartum booklet. Activity: Advance as tolerated. Pelvic rest for 6 weeks.  Diet: routine diet  Future Appointments: Future Appointments  Date Time Provider [place]  07/19/2020  2:00 PM [facility] NURSE [facility]  08/10/2020  2:35 PM Clarnce Flock, MD [facility]   Follow up Visit:  Please schedule this patient for a In person postpartum visit in 4 weeks with the following provider: Any provider. Additional Postpartum F/U:Incision check 1 week and [facility] string [facility] at [facility]  Low risk pregnancy complicated by: n/a Delivery mode:  C-Section, Low Transverse  Anticipated Birth Control:  PP IUD placed   Renee Harder, MSN, CNM 07/14/20 11:52 AM

## 2020-07-13 LAB — CBC
HCT: 31.3 % — ABNORMAL LOW (ref 36.0–46.0)
Hemoglobin: 10 g/dL — ABNORMAL LOW (ref 12.0–15.0)
MCH: 27.7 pg (ref 26.0–34.0)
MCHC: 31.9 g/dL (ref 30.0–36.0)
MCV: 86.7 fL (ref 80.0–100.0)
Platelets: 84 10*3/uL — ABNORMAL LOW (ref 150–400)
RBC: 3.61 MIL/uL — ABNORMAL LOW (ref 3.87–5.11)
RDW: 14.8 % (ref 11.5–15.5)
WBC: 9.4 10*3/uL (ref 4.0–10.5)
nRBC: 0 % (ref 0.0–0.2)

## 2020-07-13 LAB — BIRTH TISSUE RECOVERY COLLECTION (PLACENTA DONATION)

## 2020-07-13 MED ORDER — OXYCODONE HCL 5 MG PO TABS
5.0000 mg | ORAL_TABLET | ORAL | Status: DC | PRN
  Administered 2020-07-13 – 2020-07-14 (×5): 10 mg via ORAL
  Filled 2020-07-13 (×5): qty 2

## 2020-07-13 MED ORDER — ACETAMINOPHEN 500 MG PO TABS
1000.0000 mg | ORAL_TABLET | Freq: Three times a day (TID) | ORAL | Status: DC
  Administered 2020-07-13 – 2020-07-14 (×3): 1000 mg via ORAL
  Filled 2020-07-13 (×3): qty 2

## 2020-07-13 NOTE — Clinical Social Work Maternal (Signed)
CLINICAL SOCIAL WORK MATERNAL/CHILD NOTE  Patient Details  Name: Rachel Gregory MRN: [id number] Date of Birth: 06/18/1990  Date:  07/13/2020  Clinical Social Worker Initiating Note:  Sarahelizabeth Conway, MSW, LCSWA Date/Time: Initiated:  07/13/20/0915     Child's Name:  Rachel Gregory   Biological Parents:  Mother,Father (Rachel Gregory)   Need for Interpreter:  None   Reason for Referral:  Behavioral Health Concerns   Address:  [address] [place] [address]    Phone number:  [phone] (home)     Additional phone number:   Household Members/Support Persons (HM/SP):   Household Member/Support Person 1,Household Member/Support Person 2,Household Member/Support Person 3   HM/SP Name Relationship DOB or Age  HM/SP -1 Rachel Gregory Significant Other 08/28/1993  HM/SP -2 Barry Lucas Father 04/26/1960  HM/SP -3 [name] Joel Wright Son 01/16/2017  HM/SP -4        HM/SP -5        HM/SP -6        HM/SP -7        HM/SP -8          Natural Supports (not living in the home):  Immediate Family,Spouse/significant other   Professional Supports: None   Employment: Full-time   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   Education:  Some College   Homebound arranged:    Financial Resources:  Medicaid   Other Resources:  WIC,Food Stamps    Cultural/Religious Considerations Which May Impact Care:    Strengths:  Ability to meet basic needs ,Pediatrician chosen,Home prepared for child    Psychotropic Medications:         Pediatrician:    [place]  Pediatrician List:   [place] Other ([facility])  [place]    [place]    [place]    [place]    [place]      Pediatrician Fax Number:    Risk Factors/Current Problems:  None   Cognitive State:  Goal Oriented ,Insightful ,Linear Thinking ,Alert    Mood/Affect:  Interested ,Happy ,Bright    CSW Assessment: CSW consulted for history of Bipolar Depression. CSW met with MOB to  complete assessment and offer support. CSW introduced self and role. Infant at circumcision procedure and FOB observed bedside. MOB declined to have FOB leave the room for assessment. CSW informed MOB of reason for consult. MOB was open, engaged and pleasant with CSW. MOB reported she was diagnosed with manic bipolar and ADHD at age 15. MOB stated depression was also apart of the diagnosis. MOB disclosed she was hospitalized for 6 to 7 months at [facility] when she received the diagnosis. MOB reported she was prescribed Depakote and Aderral at the time, to treat the diagnosis. MOB stated she has not taken medication since the age of 19 and has not had any symptoms of her diagnosis since the age of 15. MOB reported she had an awesome pregnancy, sharing that it was better than her first. MOB stated she is currently feeling good and denies any SI or HI. MOB identified FOB, her dad and best friend as supports. MOB did not display any acute mental health symptoms.  MOB reported she lives with FOB, her 3 yo son and her father. Both MOB and FOB are employed full-time with [employer]. MOB receives WIC and food stamp benefits. MOB is aware she can contact WIC to have infant added to benefits.  CSW provided education regarding the baby blues period versus PPD and provided resources.   CSW provided the New Mom Checklist and encouraged MOB to self evaluate and contact a medical professional if symptoms are noted at any time.  CSW provided review of Sudden Infant Death Syndrome (SIDS) precautions. MOB reported she has all essentials for infant, including a crib and car seat. MOB denies any barriers to follow-up care. MOB reported she has no additional needs at this time.    CSW identifies no further need for intervention and no barriers to discharge at this time.  CSW Plan/Description:  No Further Intervention Required/No Barriers to Discharge,Perinatal Mood and Anxiety Disorder (PMADs) Education,Sudden Infant Death Syndrome  (SIDS) Education,Other Information/Referral to Community Resources,Other Patient/Family Education    Tajh Livsey J Senan Urey, LCSWA 07/13/2020, 9:57 AM 

## 2020-07-13 NOTE — Plan of Care (Signed)
  Problem: Education: Goal: Knowledge of condition will improve Outcome: Completed/Met   Problem: Life Cycle: Goal: Chance of risk for complications during the postpartum period will decrease Outcome: Completed/Met

## 2020-07-13 NOTE — Progress Notes (Signed)
Subjective: POD#1 elective rLTCS Patient is doing well without complaints. Ambulating without difficulty. Passing flatus. Tolerating PO. Abdominal pain improved. Vaginal bleeding decreased. Foley catheter recently removed, has not spontaneously voided yet.  Objective: Vital signs in last 24 hours: Temp:  [98 F (36.7 C)-98.6 F (37 C)] 98.2 F (36.8 C) (05/03 0300) Pulse Rate:  [55-113] 76 (05/02 1611) Resp:  [13-19] 18 (05/03 0533) BP: (83-130)/(47-72) 122/55 (05/03 0300) SpO2:  [99 %-100 %] 100 % (05/03 0533) Weight:  [462 kg] 118 kg (05/02 0646)  Physical Exam:  General: alert, cooperative and no distress Lochia: appropriate Uterine Fundus: firm Incision: pressure dressing c/d/i DVT Evaluation: No evidence of DVT seen on physical exam.  Recent Labs    07/12/20 0657  HGB 13.1  HCT 41.5    Assessment/Plan: POD#1 rLTCS-elective  -doing well, meeting pp milestones  -VSS  -await spontaneous void  -postplacental liletta placed  -desires circ for baby, consented  -POD#1 hgb pending  #gestational thrombocytopenia  -cbc pending  -no evidence of bleeding  Plan for discharge tomorrow per patient request.  Alric Seton 07/13/2020, 5:44 AM

## 2020-07-14 ENCOUNTER — Encounter: Payer: Medicaid Other | Admitting: Obstetrics & Gynecology

## 2020-07-14 LAB — CBC
HCT: 31.3 % — ABNORMAL LOW (ref 36.0–46.0)
Hemoglobin: 10 g/dL — ABNORMAL LOW (ref 12.0–15.0)
MCH: 27.9 pg (ref 26.0–34.0)
MCHC: 31.9 g/dL (ref 30.0–36.0)
MCV: 87.4 fL (ref 80.0–100.0)
Platelets: 93 10*3/uL — ABNORMAL LOW (ref 150–400)
RBC: 3.58 MIL/uL — ABNORMAL LOW (ref 3.87–5.11)
RDW: 15.1 % (ref 11.5–15.5)
WBC: 7.4 10*3/uL (ref 4.0–10.5)
nRBC: 0 % (ref 0.0–0.2)

## 2020-07-14 MED ORDER — COCONUT OIL OIL: 1.0000 "application " | TOPICAL_OIL | 0 refills | Status: DC | PRN

## 2020-07-14 MED ORDER — OXYCODONE HCL 5 MG PO TABS: 5.0000 mg | ORAL_TABLET | Freq: Four times a day (QID) | ORAL | 0 refills | Status: DC | PRN

## 2020-07-14 MED ORDER — IBUPROFEN 800 MG PO TABS: 800.0000 mg | ORAL_TABLET | Freq: Three times a day (TID) | ORAL | 0 refills | Status: DC | PRN

## 2020-07-14 MED ORDER — ACETAMINOPHEN 500 MG PO TABS: 1000.0000 mg | ORAL_TABLET | Freq: Four times a day (QID) | ORAL | 0 refills | Status: DC | PRN

## 2020-07-15 ENCOUNTER — Telehealth: Payer: Self-pay | Admitting: *Deleted

## 2020-07-15 NOTE — Telephone Encounter (Signed)
Transition Care Management Follow-up Telephone Call  Date of discharge and from where: 07/14/2020 - [facility]  How have you been since you were released from the hospital? "Good"  Any questions or concerns? No  Items Reviewed:  Did the pt receive and understand the discharge instructions provided? Yes   Medications obtained and verified? Yes   Other? No   Any new allergies since your discharge? No   Dietary orders reviewed? No  Do you have support at home? Yes    Functional Questionnaire: (I = Independent and D = Dependent) ADLs: I  Bathing/Dressing- I  Meal Prep- I  Eating- I  Maintaining continence- I  Transferring/Ambulation- I  Managing Meds- I  Follow up appointments reviewed:   PCP Hospital f/u appt confirmed? No   Specialist Hospital f/u appt confirmed? Yes  Scheduled to see OBGYN on 07/19/2020 @ 1400 and 08/10/2020 @ 1435.  Are transportation arrangements needed? No   If their condition worsens, is the pt aware to call PCP or go to the Emergency Dept.? Yes  Was the patient provided with contact information for the PCP's office or ED? Yes  Was to pt encouraged to call back with questions or concerns? Yes

## 2020-07-19 ENCOUNTER — Other Ambulatory Visit: Payer: Self-pay

## 2020-07-19 ENCOUNTER — Encounter: Payer: Self-pay | Admitting: *Deleted

## 2020-07-19 ENCOUNTER — Ambulatory Visit (INDEPENDENT_AMBULATORY_CARE_PROVIDER_SITE_OTHER): Payer: Medicaid Other | Admitting: *Deleted

## 2020-07-19 VITALS — BP 130/63 | HR 49 | Ht 65.0 in | Wt 254.2 lb

## 2020-07-19 DIAGNOSIS — Z4889 Encounter for other specified surgical aftercare: Secondary | ICD-10-CM

## 2020-07-19 DIAGNOSIS — Z9889 Other specified postprocedural states: Secondary | ICD-10-CM

## 2020-07-19 DIAGNOSIS — M7989 Other specified soft tissue disorders: Secondary | ICD-10-CM

## 2020-07-19 NOTE — Progress Notes (Signed)
Patient seen and assessed by nursing staff.  Agree with documentation and plan. Significantly more swelling on left. Neg Homan's. Doubt DVT, but risk factor of recent pregnancy, major surgery, will rule out.

## 2020-07-19 NOTE — Progress Notes (Signed)
Pt presents for incision check following C/S on 5/2. Pt stated that she removed honeycomb dressing today. Incision was assessed and found to be healing well. Skin edges well approximated and without evidence of swelling, redness, bleeding or drainage. Proper cleansing and care of incision was explained. Pt expressed concern for extreme swelling of Lt lower leg. She denies pain and has negative Homan's. Dr. Shawnie Pons in room for evaluation. Vascular US scheduled @ [facility] on Tuesday 5/10 @ 10:00. Pt was advised to arrive @ 9:45 am. Post partum appointment is scheduled on 5/31 @ 2:35 pm. She voiced understanding of all information and instructions given.

## 2020-07-20 ENCOUNTER — Ambulatory Visit (HOSPITAL_COMMUNITY)
Admission: RE | Admit: 2020-07-20 | Discharge: 2020-07-20 | Disposition: A | Discharge status: Home / Self Care | Payer: Medicaid Other | Source: Ambulatory Visit | Attending: Family Medicine | Admitting: Family Medicine

## 2020-07-20 DIAGNOSIS — Z9889 Other specified postprocedural states: Secondary | ICD-10-CM | POA: Diagnosis not present

## 2020-07-20 DIAGNOSIS — M7989 Other specified soft tissue disorders: Secondary | ICD-10-CM | POA: Diagnosis not present

## 2020-07-20 NOTE — Progress Notes (Signed)
Lower extremity venous has been completed.   Preliminary results in CV Proc.   Blanch Media 07/20/2020 10:06 AM

## 2020-07-26 ENCOUNTER — Other Ambulatory Visit: Payer: Self-pay

## 2020-07-26 ENCOUNTER — Ambulatory Visit (INDEPENDENT_AMBULATORY_CARE_PROVIDER_SITE_OTHER): Payer: Medicaid Other | Admitting: Obstetrics and Gynecology

## 2020-07-26 ENCOUNTER — Encounter: Payer: Self-pay | Admitting: Obstetrics and Gynecology

## 2020-07-26 VITALS — BP 123/73 | HR 100 | Ht 65.0 in | Wt 238.0 lb

## 2020-07-26 DIAGNOSIS — Z975 Presence of (intrauterine) contraceptive device: Secondary | ICD-10-CM

## 2020-07-26 NOTE — Progress Notes (Signed)
Pt here to have her IUD strings trimmed and incision check Had IUP placed pos placental after c section.  Denies any bowel or bladder dysfunction  Bottle feeding  PE AF VSS Lungs clear Heart RRR Abd soft, + BS, incision well healed GU Nl EGBUS, IUD strings trimmed  A/P Doing well. Continue to increase ADL's as tolerates. Keep routine PP appt.

## 2020-07-27 ENCOUNTER — Inpatient Hospital Stay (HOSPITAL_COMMUNITY): Admission: AD | Admit: 2020-07-27 | Payer: Medicaid Other | Source: Home / Self Care | Admitting: Family Medicine

## 2020-08-10 ENCOUNTER — Encounter: Payer: Self-pay | Admitting: Family Medicine

## 2020-08-10 ENCOUNTER — Other Ambulatory Visit: Payer: Self-pay

## 2020-08-10 ENCOUNTER — Ambulatory Visit (INDEPENDENT_AMBULATORY_CARE_PROVIDER_SITE_OTHER): Payer: Medicaid Other | Admitting: Family Medicine

## 2020-08-10 NOTE — Patient Instructions (Signed)
Cesarean Delivery, Care After This sheet gives you information about how to care for yourself after your procedure. Your health care provider may also give you more specific instructions. If you have problems or questions, contact your health care provider. What can I expect after the procedure? After the procedure, it is common to have:  A small amount of blood or clear fluid coming from the incision.  Some redness, swelling, and pain in your incision area.  Some abdominal pain and soreness.  Vaginal bleeding (lochia). Even though you did not have a vaginal delivery, you will still have vaginal bleeding and discharge.  Pelvic cramps.  Fatigue. You may have pain, swelling, and discomfort in the tissue between your vagina and your anus (perineum) if:  Your C-section was unplanned, and you were allowed to labor and push.  An incision was made in the area (episiotomy) or the tissue tore during attempted vaginal delivery. Follow these instructions at home: Incision care  Follow instructions from your health care provider about how to take care of your incision. Make sure you: ? Wash your hands with soap and water before you change your bandage (dressing). If soap and water are not available, use hand sanitizer. ? If you have a dressing, change it or remove it as told by your health care provider. ? Leave stitches (sutures), skin staples, skin glue, or adhesive strips in place. These skin closures may need to stay in place for 2 weeks or longer. If adhesive strip edges start to loosen and curl up, you may trim the loose edges. Do not remove adhesive strips completely unless your health care provider tells you to do that.  Check your incision area every day for signs of infection. Check for: ? More redness, swelling, or pain. ? More fluid or blood. ? Warmth. ? Pus or a bad smell.  Do not take baths, swim, or use a hot tub until your health care provider says it's okay. Ask your health  care provider if you can take showers.  When you cough or sneeze, hug a pillow. This helps with pain and decreases the chance of your incision opening up (dehiscing). Do this until your incision heals.   Medicines  Take over-the-counter and prescription medicines only as told by your health care provider.  If you were prescribed an antibiotic medicine, take it as told by your health care provider. Do not stop taking the antibiotic even if you start to feel better.  Do not drive or use heavy machinery while taking prescription pain medicine. Lifestyle  Do not drink alcohol. This is especially important if you are breastfeeding or taking pain medicine.  Do not use any products that contain nicotine or tobacco, such as cigarettes, e-cigarettes, and chewing tobacco. If you need help quitting, ask your health care provider. Eating and drinking  Drink at least 8 eight-ounce glasses of water every day unless told not to by your health care provider. If you breastfeed, you may need to drink even more water.  Eat high-fiber foods every day. These foods may help prevent or relieve constipation. High-fiber foods include: ? Whole grain cereals and breads. ? Brown rice. ? Beans. ? Fresh fruits and vegetables. Activity  If possible, have someone help you care for your baby and help with household activities for at least a few days after you leave the hospital.  Return to your normal activities as told by your health care provider. Ask your health care provider what activities are safe for   you.  Rest as much as possible. Try to rest or take a nap while your baby is sleeping.  Do not lift anything that is heavier than 10 lbs (4.5 kg), or the limit that you were told, until your health care provider says that it is safe.  Talk with your health care provider about when you can engage in sexual activity. This may depend on your: ? Risk of infection. ? How fast you heal. ? Comfort and desire to  engage in sexual activity.   General instructions  Do not use tampons or douches until your health care provider approves.  Wear loose, comfortable clothing and a supportive and well-fitting bra.  Keep your perineum clean and dry. Wipe from front to back when you use the toilet.  If you pass a blood clot, save it and call your health care provider to discuss. Do not flush blood clots down the toilet before you get instructions from your health care provider.  Keep all follow-up visits for you and your baby as told by your health care provider. This is important. Contact a health care provider if:  You have: ? A fever. ? Bad-smelling vaginal discharge. ? Pus or a bad smell coming from your incision. ? Difficulty or pain when urinating. ? A sudden increase or decrease in the frequency of your bowel movements. ? More redness, swelling, or pain around your incision. ? More fluid or blood coming from your incision. ? A rash. ? Nausea. ? Little or no interest in activities you used to enjoy. ? Questions about caring for yourself or your baby.  Your incision feels warm to the touch.  Your breasts turn red or become painful or hard.  You feel unusually sad or worried.  You vomit.  You pass a blood clot from your vagina.  You urinate more than usual.  You are dizzy or light-headed. Get help right away if:  You have: ? Pain that does not go away or get better with medicine. ? Chest pain. ? Difficulty breathing. ? Blurred vision or spots in your vision. ? Thoughts about hurting yourself or your baby. ? New pain in your abdomen or in one of your legs. ? A severe headache.  You faint.  You bleed from your vagina so much that you fill more than one sanitary pad in one hour. Bleeding should not be heavier than your heaviest period. Summary  After the procedure, it is common to have pain at your incision site, abdominal cramping, and slight bleeding from your vagina.  Check  your incision area every day for signs of infection.  Tell your health care provider about any unusual symptoms.  Keep all follow-up visits for you and your baby as told by your health care provider. This information is not intended to replace advice given to you by your health care provider. Make sure you discuss any questions you have with your health care provider. Document Revised: 09/05/2017 Document Reviewed: 09/05/2017 Elsevier Patient Education  2021 Elsevier Inc.  

## 2020-08-10 NOTE — Progress Notes (Signed)
Post Partum Visit Note  Rachel Gregory is a 30 y.o. [phone] female who presents for a postpartum visit. She is 4 weeks postpartum following a repeat cesarean section.  I have fully reviewed the prenatal and intrapartum course. The delivery was at [redacted]w[redacted]d gestational weeks.  Anesthesia: spinal. Postpartum course has been uneventful. Baby is doing well. Baby is feeding by bottle - Gerber Gentle. Bleeding staining only. Bowel function is normal. Bladder function is normal. Patient is not sexually active. Contraception method is IUD. Postpartum depression screening: negative.    The pregnancy intention screening data noted above was reviewed.  Edinburgh Postnatal Depression Scale - 08/10/20 1458      Edinburgh Postnatal Depression Scale:  In the Past 7 Days   I have been able to laugh and see the funny side of things. 0    I have looked forward with enjoyment to things. 0    I have blamed myself unnecessarily when things went wrong. 0    I have been anxious or worried for no good reason. 0    I have felt scared or panicky for no good reason. 0    Things have been getting on top of me. 0    I have been so unhappy that I have had difficulty sleeping. 0    I have felt sad or miserable. 0    I have been so unhappy that I have been crying. 0    The thought of harming myself has occurred to me. 0    Edinburgh Postnatal Depression Scale Total 0           Health Maintenance Due  Topic Date Due  . COVID-19 Vaccine (1) Never done    The following portions of the patient's history were reviewed and updated as appropriate: allergies, current medications, past family history, past medical history, past social history, past surgical history and problem list.  Review of Systems Pertinent items noted in HPI and remainder of comprehensive ROS otherwise negative.  Objective:  BP 109/74   Pulse 94   Wt 238 lb 11.2 oz (108.3 kg)   BMI 39.72 kg/m    General:  alert, cooperative and appears  stated age   Breasts:  not indicated  Lungs: comfortable on room air  GU exam:  normal, strings 4cm, trimmed to 3 cm       Assessment:    There are no diagnoses linked to this encounter.  Normal postpartum exam.   Plan:   Essential components of care per ACOG recommendations:  1.  Mood and well being: Patient with negative depression screening today. Reviewed local resources for support.  - Patient tobacco use? Yes. Patient desires to quit? No.   - hx of drug use? No.    2. Infant care and feeding:  -Patient currently breastmilk feeding? No.  -Social determinants of health (SDOH) reviewed in EPIC. No concerns  3. Sexuality, contraception and birth spacing - Patient does not want a pregnancy in the next year.  Desired family size is unsure  - Reviewed forms of contraception in tiered fashion. Patient desired IUD for now, though wants removed if it keeps cramping in a few months today.  Strings trimmed per patient request.  - Discussed birth spacing of 18 months  4. Sleep and fatigue -Encouraged family/partner/community support of 4 hrs of uninterrupted sleep to help with mood and fatigue  5. Physical Recovery  - Discussed patients delivery and complications.  - Patient had a C-section repeat;  no problems after deliver.  - Patient has urinary incontinence? No. - Patient is safe to resume physical and sexual activity   6.  Health Maintenance - HM due items addressed Yes - Last pap smear  Diagnosis  Date Value Ref Range Status  01/12/2020 (A)  Final   - High grade squamous intraepithelial lesion (HSIL)   Pap smear not done at today's visit. 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 -Breast Cancer screening indicated? No.   7. Chronic Disease/Pregnancy Condition follow up: gestational thrombocytopenia  - check CBC at next appointment, refer to heme if not resolved   - PCP follow  up  Venora Maples, MD Center for [facility], [facility]

## 2020-09-01 ENCOUNTER — Encounter: Payer: Self-pay | Admitting: Family Medicine

## 2020-09-07 ENCOUNTER — Other Ambulatory Visit: Payer: Self-pay

## 2020-09-07 ENCOUNTER — Ambulatory Visit (INDEPENDENT_AMBULATORY_CARE_PROVIDER_SITE_OTHER): Payer: Medicaid Other | Admitting: Family Medicine

## 2020-09-07 ENCOUNTER — Encounter: Payer: Self-pay | Admitting: Family Medicine

## 2020-09-07 ENCOUNTER — Other Ambulatory Visit (HOSPITAL_COMMUNITY)
Admission: RE | Admit: 2020-09-07 | Discharge: 2020-09-07 | Disposition: A | Discharge status: Home / Self Care | Payer: Medicaid Other | Source: Ambulatory Visit | Attending: Family Medicine | Admitting: Family Medicine

## 2020-09-07 VITALS — BP 106/57 | HR 53 | Wt 243.0 lb

## 2020-09-07 DIAGNOSIS — Z3202 Encounter for pregnancy test, result negative: Secondary | ICD-10-CM | POA: Diagnosis not present

## 2020-09-07 DIAGNOSIS — N879 Dysplasia of cervix uteri, unspecified: Secondary | ICD-10-CM | POA: Insufficient documentation

## 2020-09-07 LAB — POCT PREGNANCY, URINE: Preg Test, Ur: NEGATIVE

## 2020-09-07 NOTE — Patient Instructions (Signed)
Colposcopy, Care After This sheet gives you information about how to care for yourself after your procedure. Your doctor may also give you more specific instructions. If youhave problems or questions, contact your doctor. What can I expect after the procedure? If you did not have a sample of your tissue taken out (did not have a biopsy), you may only have some spotting of blood for a few days. You can go back toyour normal activities. If you had a sample of your tissue taken out, it is common to have: Soreness and mild pain. These may last for a few days. A light-headed feeling. Mild bleeding or fluid (discharge) coming from your vagina. The fluid will look dark and grainy. You may have this for a few days. The fluid may be caused by a liquid that was used during your procedure. You may need to wear a sanitary pad. Spotting of blood for at least 48 hours after the procedure. Follow these instructions at home: Medicines Take over-the-counter and prescription medicines only as told by your doctor. Ask your doctor what medicines you can start taking again. This is very important if you take blood thinners. Activity Limit your activity for the first day after your procedure as told by your doctor. For at least 3 days, or for as long as told by your doctor, avoid: Douching. Using tampons. Having sex. Return to your normal activities as told by your doctor. Ask your doctor what activities are safe for you. General instructions  Drink enough fluid to keep your pee (urine) pale yellow. Ask your doctor if you may take baths, swim, or use a hot tub. You may take showers. If you use birth control (contraception), keep using it. Keep all follow-up visits as told by your doctor. This is important.  Contact a doctor if: You get a skin rash. Get help right away if: You bleed a lot from your vagina. A lot of bleeding means you use more than one pad an hour for 2 hours in a row. You have clumps of  blood (blood clots) coming from your vagina. You have a fever or chills. You have signs of infection. This may be fluid coming from your vagina that is: Different than normal. Yellow. Bad-smelling. You have very bad pain or cramps in your lower belly that do not get better with medicine. You faint. Summary If you did not have a sample of your tissue taken out, you may only have some spotting of blood for a few days. You can go back to your normal activities. If you had a sample of your tissue taken out, it is common to have mild pain for a few days and spotting for 48 hours. Avoid douching, using tampons, and having sex for at least 3 days after the procedure or for as long as told. Get help right away if you have a lot of bleeding, very bad pain, or signs of infection. This information is not intended to replace advice given to you by your health care provider. Make sure you discuss any questions you have with your healthcare provider. Document Revised: 12/30/2019 Document Reviewed: 02/26/2019 Elsevier Patient Education  2022 Elsevier Inc.  

## 2020-09-07 NOTE — Progress Notes (Signed)
    GYNECOLOGY CLINIC COLPOSCOPY PROCEDURE NOTE  30 y.o. [id number] here for colposcopy for pap finding of:  Lab Results  Component Value Date   DIAGPAP (A) 01/12/2020    - High grade squamous intraepithelial lesion (HSIL)    Discussed role for HPV in cervical dysplasia, need for surveillance, nature of the procedure, and risks and benefits.  Pregnant test: Lab Results  Component Value Date   PREGTESTUR NEGATIVE 09/07/2020    Allergies  Allergen Reactions   Bee Venom Hives and Swelling    Patient given informed consent, signed copy in the chart, time out was performed.    Placed in lithotomy position. Cervix viewed with speculum and colposcope after application of acetic acid.   Colposcopy Adequacy Cervix fully visualized: Yes  SCJ fully visualized: No    Colposcopy Findings dense acetowhite lesion within a lesion noted at 6 o'clock, more faint circumferential lesion.   Corresponding biopsies were obtained from 6 and 12 o clock.    ECC specimen was obtained.  All specimens were labeled and sent to pathology.  Hemostatic measures: Pressure, Monsel's solution, and Silver nitrate  Complications: significant bleeding from both biopsy sites that precluded further biopsies and required a significant amount of pressure, Monsels, and silver nitrate. Came close to throwing a stitch but luckily stopped.  Patient tolerated the procedure fairly well.  OBGyn Exam  Colposcopy Impressions High grade features  Plan Treatment plan pending biopsy results, per patient preference they will be communicated by MyChart.  Patient was given post procedure instructions.  Will follow up pathology and manage accordingly; patient will be contacted with results and recommendations.  Routine preventative health maintenance measures emphasized.  Venora Maples, MD/MPH Attending Family Medicine Physician, [facility] for [facility], [facility]

## 2020-09-08 LAB — SURGICAL PATHOLOGY

## 2020-11-09 ENCOUNTER — Ambulatory Visit (INDEPENDENT_AMBULATORY_CARE_PROVIDER_SITE_OTHER): Payer: Medicaid Other | Admitting: Family Medicine

## 2020-11-09 ENCOUNTER — Other Ambulatory Visit: Payer: Self-pay

## 2020-11-09 ENCOUNTER — Telehealth: Payer: Self-pay | Admitting: *Deleted

## 2020-11-09 ENCOUNTER — Encounter: Payer: Self-pay | Admitting: Family Medicine

## 2020-11-09 ENCOUNTER — Encounter: Payer: Self-pay | Admitting: *Deleted

## 2020-11-09 ENCOUNTER — Emergency Department (HOSPITAL_COMMUNITY)
Admission: EM | Admit: 2020-11-09 | Discharge: 2020-11-09 | Discharge status: Against Medical Advice | Payer: Medicaid Other

## 2020-11-09 DIAGNOSIS — R1032 Left lower quadrant pain: Secondary | ICD-10-CM | POA: Diagnosis not present

## 2020-11-09 DIAGNOSIS — R1031 Right lower quadrant pain: Secondary | ICD-10-CM | POA: Diagnosis not present

## 2020-11-09 LAB — POCT URINALYSIS DIP (DEVICE)
Glucose, UA: NEGATIVE mg/dL
Leukocytes,Ua: NEGATIVE
Nitrite: NEGATIVE
Protein, ur: 100 mg/dL — AB
Specific Gravity, Urine: >=1.03 (ref 1.005–1.030)
Urobilinogen, UA: 1 mg/dL (ref 0.0–1.0)
pH: 6 (ref 5.0–8.0)

## 2020-11-09 LAB — POCT PREGNANCY, URINE: Preg Test, Ur: NEGATIVE

## 2020-11-09 NOTE — Progress Notes (Signed)
GYNECOLOGY OFFICE VISIT NOTE  History:   Rachel Gregory is a 30 y.o. [phone] here today for pelvic pain.  Pain started last night around 10 minutes are having intercourse Sharp and stabbing and constant Feels like contractions but not coming in a wave, they are constant Last period August 13-17 Also started bleeding last night when the pain started Just with wiping, at first bright but now more brown colored No n/v No fever No vaginal discharge No burning or pain with urination No kidney stones history No blood in urine that she has seen  Health Maintenance Due  Topic Date Due   COVID-19 Vaccine (1) Never done   Pneumococcal Vaccine 63-89 Years old (1 - PCV) Never done   INFLUENZA VACCINE  10/11/2020    Past Medical History:  Diagnosis Date   ADHD (attention deficit hyperactivity disorder)    Allergy    Asthma    Bipolar 1 disorder (HCC)    Depression    Endometriosis    Plantar fasciitis of right foot     Past Surgical History:  Procedure Laterality Date   BIOPSY ENDOMETRIAL     CESAREAN SECTION N/A 01/16/2017   Procedure: CESAREAN SECTION;  Surgeon: Levie Heritage, DO;  Location: [facility];  Service: Obstetrics;  Laterality: N/A;   CESAREAN SECTION N/A 07/12/2020   Procedure: CESAREAN SECTION;  Surgeon: Venora Maples, MD;  Location: [facility] LD OR[facility];  Service: Obstetrics;  Laterality: N/A;   COLPOSCOPY  02/18/2020       COLPOSCOPY  05/24/2020       DILATION AND CURETTAGE OF UTERUS  04/2015   spontaneous abortion    The following portions of the patient's history were reviewed and updated as appropriate: allergies, current medications, past family history, past medical history, past social history, past surgical history and problem list.   Health Maintenance:   Last pap: Lab Results  Component Value Date   DIAGPAP (A) 01/12/2020    - High grade squamous intraepithelial lesion (HSIL)     Last mammogram:  N/a    Review of Systems:  Pertinent  items noted in HPI and remainder of comprehensive ROS otherwise negative.  Physical Exam:  BP 113/70   Pulse 68   Wt 238 lb 4.8 oz (108.1 kg)   BMI 39.66 kg/m  CONSTITUTIONAL: Well-developed, well-nourished female, looks very uncomfortable. HEENT:  Normocephalic, atraumatic. External right and left ear normal. No scleral icterus.  NECK: Normal range of motion, supple, no masses noted on observation SKIN: No rash noted. Not diaphoretic. No erythema. No pallor. MUSCULOSKELETAL: Normal range of motion. No edema noted. NEUROLOGIC: Alert and oriented to person, place, and time. Normal muscle tone coordination.  PSYCHIATRIC: Normal mood and affect. Normal behavior. Normal judgment and thought content. RESPIRATORY: Effort normal, no problems with respiration noted ABDOMEN: No masses noted. Diffusely tender to palpation, much worse in lower quadrants with LLQ>RLQ, some guarding and rebound on exam PELVIC: Normal appearing external genitalia; normal appearing vaginal mucosa and cervix.  IUD strings visualized and appear in appropriate place. +CMT, no masses appreciated but very tender on bimanual exam throughout.  Bedside Ultrasound Pt informed that the ultrasound is considered a limited ultrasound and is not intended to be a complete ultrasound exam. Explained that the purpose of today's ultrasound is to assess for   abdominal pathology .  Patient acknowledges the purpose of the exam and the limitations of the study.    My read: normal kidneys bilaterally without hydronephrosis. IUD seems  to be in place. Due to habitus unable to assess for free fluid in the pelvis.   Labs and Imaging No results found for this or any previous visit (from the past 168 hour(s)). No results found.    Assessment and Plan:   Problem List Items Addressed This Visit       Other   Acute bilateral lower abdominal pain    Patient with marked tenderness on exam and mild rebound. Also with marked cervical motion  tenderness. UA largely unremarkable with only trace blood and UPT negative. Bedside US shows normal kidneys without evidence of hydronephrosis, unfortunately unable to assess for free fluid in pelvis due to habitus but none in Morrisons pouch or peri-splenic. DDx ovarian torsion, PID/TOA, ruptured ovarian cyst, appendicitis. Vital signs stable, OK for transport to ED by private vehicle. ED called and signout given to provider.        Routine preventative health maintenance measures emphasized. Please refer to After Visit Summary for other counseling recommendations.   Return for as needed after ED evaluation.    Total face-to-face time with patient: 20 minutes.  Over 50% of encounter was spent on counseling and coordination of care.   Venora Maples, MD/MPH Attending Family Medicine Physician, [facility] for [facility], [facility]

## 2020-11-09 NOTE — Assessment & Plan Note (Signed)
Patient with marked tenderness on exam and mild rebound. Also with marked cervical motion tenderness. UA largely unremarkable with only trace blood and UPT negative. Bedside US shows normal kidneys without evidence of hydronephrosis, unfortunately unable to assess for free fluid in pelvis due to habitus but none in Morrisons pouch or peri-splenic. DDx ovarian torsion, PID/TOA, ruptured ovarian cyst, appendicitis. Vital signs stable, OK for transport to ED by private vehicle. ED called and signout given to provider.

## 2020-11-09 NOTE — ED Notes (Signed)
Patient told this tech she was under the impression from her doctor that she was to be admitted directly, there is no note of this. Patient said she had to leave to pick up her kids and would contact her primary care and possibly be back later. Patient left.

## 2020-11-09 NOTE — Telephone Encounter (Signed)
Pt called office with report of abdominal pain which began last night - about 10 minutes after sex. The pain is sharp, stabbing and located behind C/S scar. She also reports having small amount of bright red bleeding when she wipes after voiding. Pt voiced additional concern that she is no longer able to feel her IUD strings which were most recently identified by her yesterday morning. I advised that I will consult with physician and call her back with plan of care recommendation. After speaking with Dr. Crissie Reese, I called pt back and left voicemail stating that he would like to see her in the office today. She should watch her Mychart account for the scheduled appointment time.

## 2021-04-15 IMAGING — US US MFM OB FOLLOW-UP
1 series · 14 of 28 positions shown · non-contrast
Comparison: none

[Series 1: us mfm ob follow-up · 31 acquisitions, 14 frames shown]
[im 2/31]
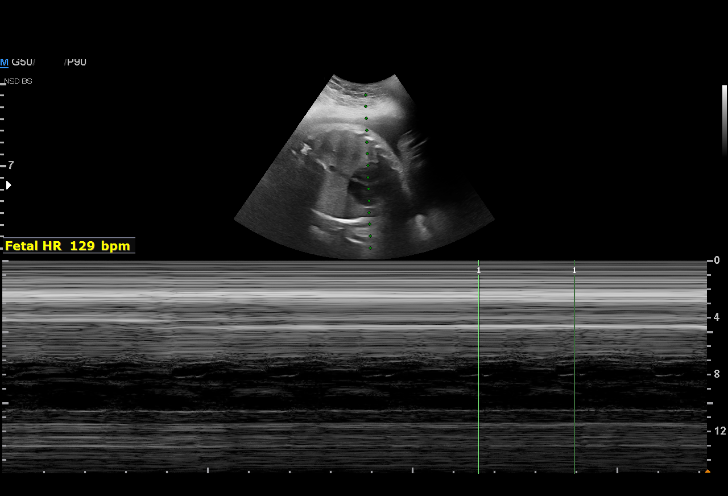
[im 4/31]
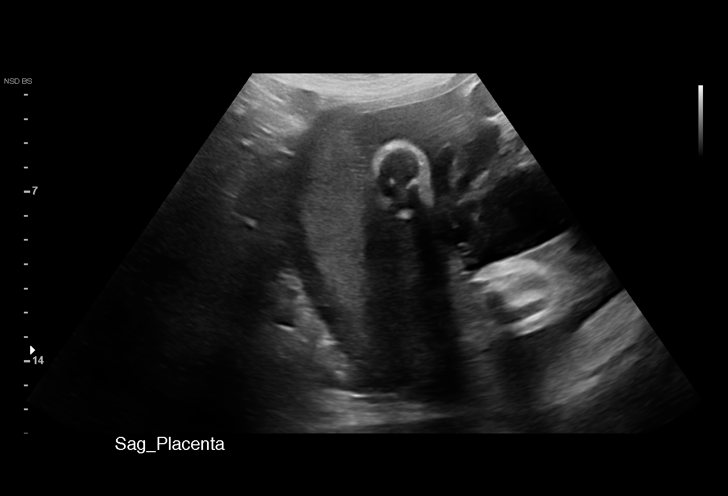
[im 6/31]
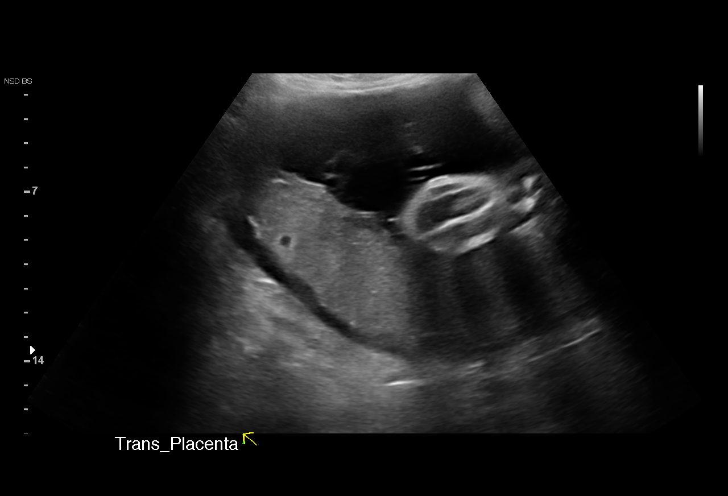
[im 8/31]
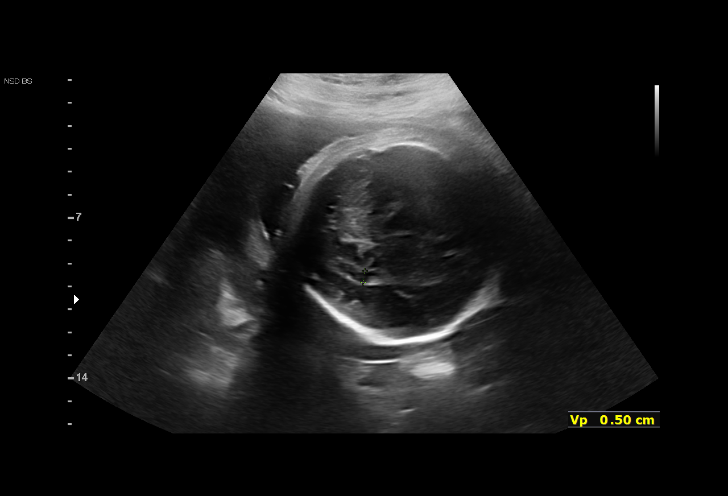
[im 11/31]
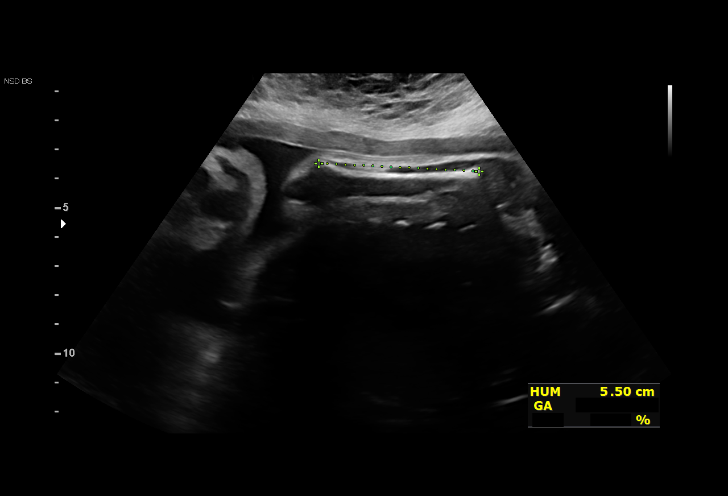
[im 13/31]
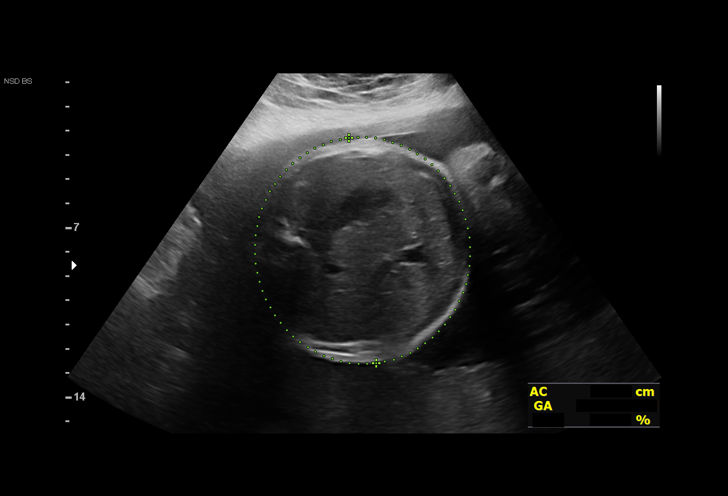
[im 15/31]
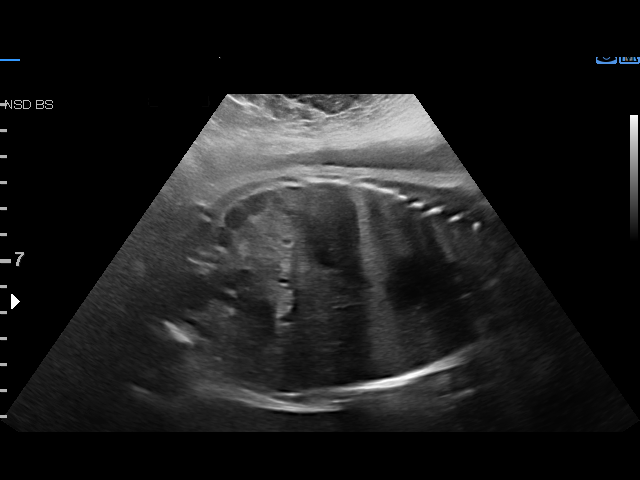
[im 17/31]
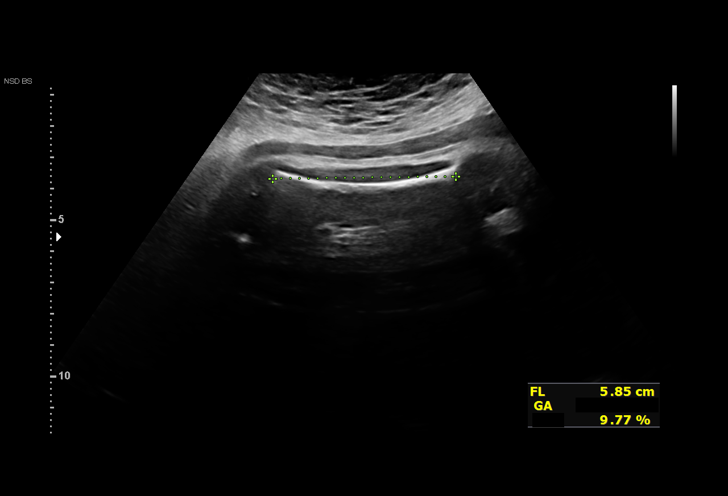
[im 19/31]
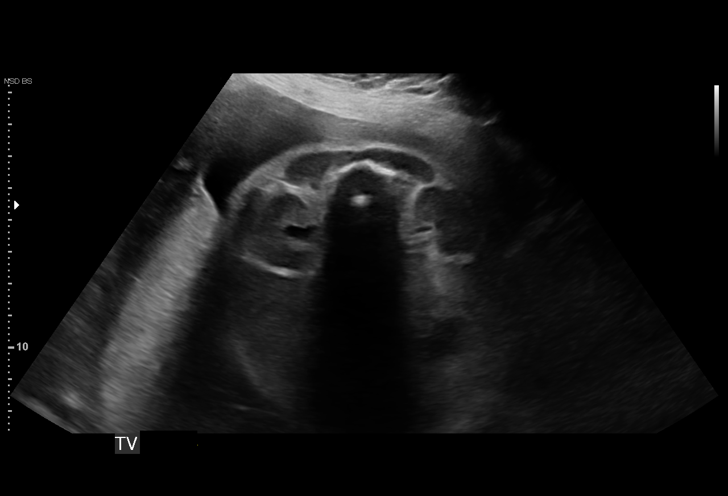
[im 22/31]
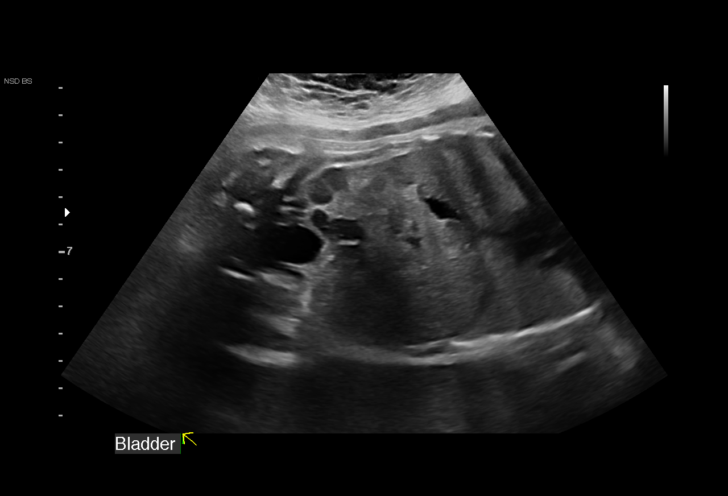
[im 24/31]
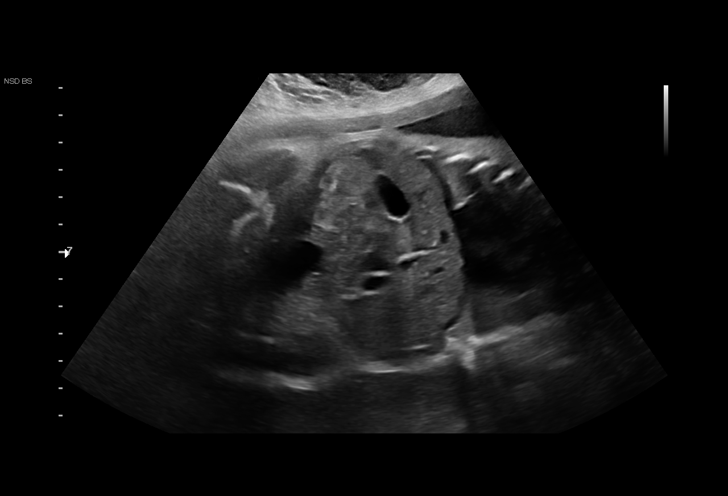
[im 26/31]
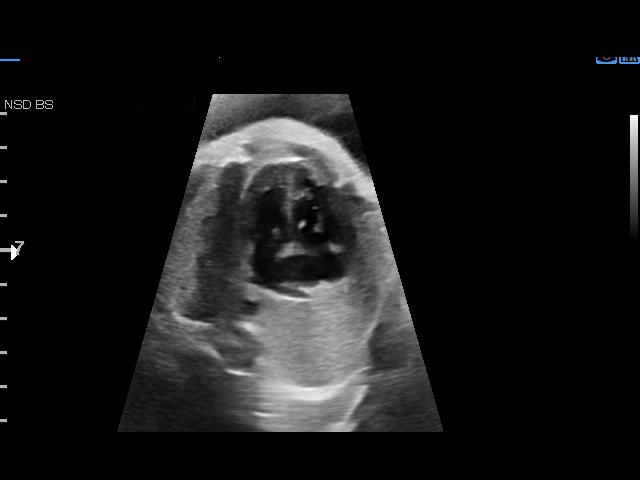
[im 28/31]
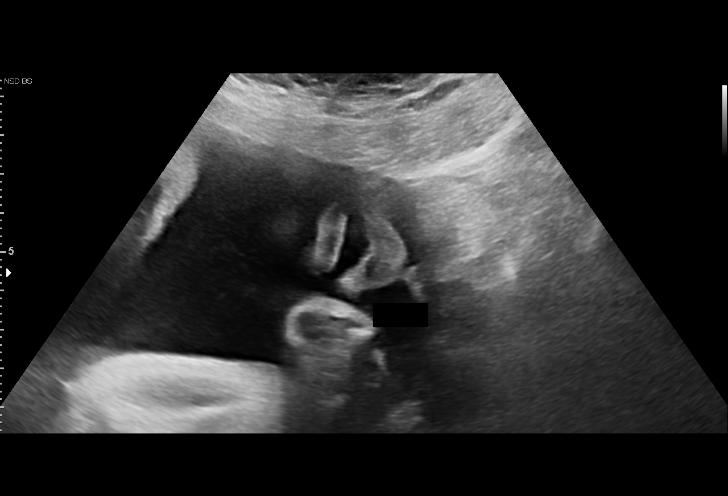
[im 31/31]
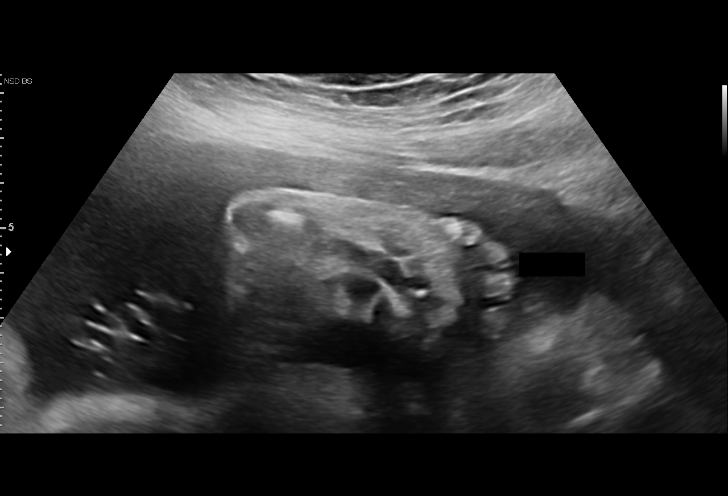

[14 of 28 positions shown; findings below may reference images not displayed]

CNM

Indications

 Obesity complicating pregnancy, second
 trimester (BMI 35)
 31 weeks gestation of pregnancy
 Medical complication of pregnancy
 (Endometriosis, Cervical Dysplasia)
 Thrombocytopenia affecting pregnancy,          O99.119,
 antepartum (Benign Gestational Platelets
 129 on 05-24-20)
 Genetic carrier (Isovaleric Anemia)
 Tobacco use complicating pregnancy
 Encounter for antenatal screening for
 malformations (LR Nips Negative AFP)
 Previous cesarean delivery, antepartum
Fetal Evaluation

 Num Of Fetuses:         1
 Fetal Heart Rate(bpm):  129
 Cardiac Activity:       Observed
 Presentation:           Cephalic
 Placenta:               Posterior
 P. Cord Insertion:      Previously Visualized

 Amniotic Fluid
 AFI FV:      Within normal limits

 AFI Sum(cm)     %Tile       Largest Pocket(cm)
 21.55           83

 RUQ(cm)       RLQ(cm)       LUQ(cm)        LLQ(cm)

Biometry

 BPD:      84.7  mm     G. Age:  34w 1d         94  %    CI:        79.14   %    70 - 86
                                                         FL/HC:      19.8   %    19.1 -
 HC:       301   mm     G. Age:  33w 3d         55  %    HC/AC:      1.05        0.96 -
 AC:      286.7  mm     G. Age:  32w 5d         73  %    FL/BPD:     70.5   %    71 - 87
 FL:       59.7  mm     G. Age:  31w 1d         18  %    FL/AC:      20.8   %    20 - 24
 HUM:      54.7  mm     G. Age:  31w 6d         50  %
 LV:          5  mm

 Est. FW:    3308  gm      4 lb 5 oz     57  %
OB History

 Gravidity:    3
 Living:       1
Gestational Age

 LMP:           31w 6d        Date:  10/23/19                 EDD:   07/29/20
 U/S Today:     32w 6d                                        EDD:   07/22/20
 Best:          31w 6d     Det. By:  LMP  (10/23/19)          EDD:   07/29/20
Anatomy

 Cranium:               Appears normal         LVOT:                   Previously seen
 Cavum:                 Previously seen        Aortic Arch:            Previously seen
 Ventricles:            Appears normal         Ductal Arch:            Previously seen
 Choroid Plexus:        Previously seen        Diaphragm:              Appears normal
 Cerebellum:            Previously seen        Stomach:                Appears normal, left
                                                                       sided
 Posterior Fossa:       Previously seen        Abdomen:                Appears normal
 Nuchal Fold:           Previously seen        Abdominal Wall:         Previously seen
 Face:                  Orbits and profile     Cord Vessels:           Previously seen
                        previously seen
 Lips:                  Previously seen        Kidneys:                Appear normal
 Palate:                Not well visualized    Bladder:                Appears normal
 Thoracic:              Appears normal         Spine:                  Previously seen
 Heart:                 Appears normal         Upper Extremities:      Previously seen
                        (4CH, axis, and
                        situs)
 RVOT:                  Previously seen        Lower Extremities:      Previously seen

 Other:  Male gender previously seen. Technically difficult due to maternal
         habitus and fetal position.
Cervix Uterus Adnexa

 Cervix
 Not visualized (advanced GA >95wks)
Comments

 This patient was seen for a follow up growth scan due to
 maternal obesity.  She denies any problems and reports that
 she has screened negative for gestational diabetes in her
 current pregnancy.
 She was informed that the fetal growth and amniotic fluid
 level appears appropriate for her gestational age.
 A follow up exam was scheduled in 4 weeks.

## 2021-04-22 DIAGNOSIS — S83011A Lateral subluxation of right patella, initial encounter: Secondary | ICD-10-CM | POA: Diagnosis not present

## 2021-04-22 DIAGNOSIS — S86911A Strain of unspecified muscle(s) and tendon(s) at lower leg level, right leg, initial encounter: Secondary | ICD-10-CM | POA: Diagnosis not present

## 2021-04-25 DIAGNOSIS — M25461 Effusion, right knee: Secondary | ICD-10-CM | POA: Diagnosis not present

## 2021-04-25 DIAGNOSIS — S83231A Complex tear of medial meniscus, current injury, right knee, initial encounter: Secondary | ICD-10-CM | POA: Diagnosis not present

## 2021-04-26 DIAGNOSIS — S83261A Peripheral tear of lateral meniscus, current injury, right knee, initial encounter: Secondary | ICD-10-CM | POA: Diagnosis not present

## 2021-04-26 DIAGNOSIS — S83231A Complex tear of medial meniscus, current injury, right knee, initial encounter: Secondary | ICD-10-CM | POA: Diagnosis not present

## 2021-04-26 DIAGNOSIS — M25461 Effusion, right knee: Secondary | ICD-10-CM | POA: Diagnosis not present

## 2021-04-26 DIAGNOSIS — S83211A Bucket-handle tear of medial meniscus, current injury, right knee, initial encounter: Secondary | ICD-10-CM | POA: Diagnosis not present

## 2021-04-26 DIAGNOSIS — S83511A Sprain of anterior cruciate ligament of right knee, initial encounter: Secondary | ICD-10-CM | POA: Diagnosis not present

## 2021-04-27 DIAGNOSIS — S83211A Bucket-handle tear of medial meniscus, current injury, right knee, initial encounter: Secondary | ICD-10-CM | POA: Diagnosis not present

## 2021-04-27 DIAGNOSIS — S83511A Sprain of anterior cruciate ligament of right knee, initial encounter: Secondary | ICD-10-CM | POA: Diagnosis not present

## 2021-05-10 DIAGNOSIS — S83211A Bucket-handle tear of medial meniscus, current injury, right knee, initial encounter: Secondary | ICD-10-CM | POA: Diagnosis not present

## 2021-05-10 DIAGNOSIS — G8918 Other acute postprocedural pain: Secondary | ICD-10-CM | POA: Diagnosis not present

## 2021-05-10 DIAGNOSIS — S83511A Sprain of anterior cruciate ligament of right knee, initial encounter: Secondary | ICD-10-CM | POA: Diagnosis not present

## 2021-05-11 DIAGNOSIS — M6281 Muscle weakness (generalized): Secondary | ICD-10-CM | POA: Diagnosis not present

## 2021-05-11 DIAGNOSIS — M25561 Pain in right knee: Secondary | ICD-10-CM | POA: Diagnosis not present

## 2021-05-13 IMAGING — US US MFM OB FOLLOW-UP
1 series · 14 of 26 positions shown · non-contrast
Comparison: none

[Series 1: us mfm ob follow-up · 26 acquisitions, 14 frames shown]
[im 1/26]
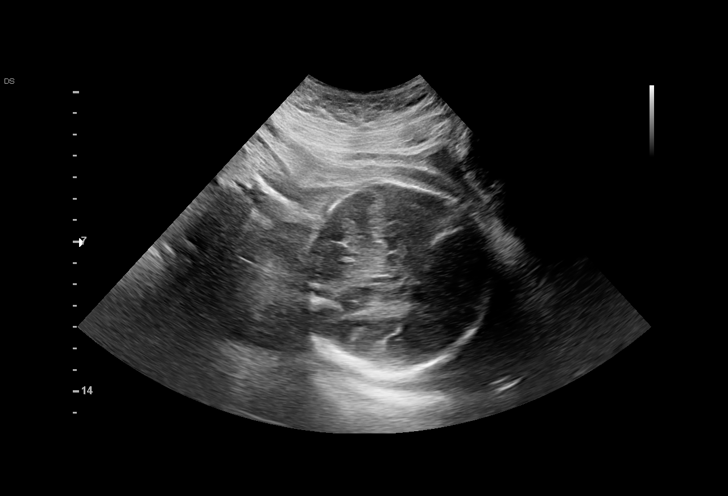
[im 3/26]
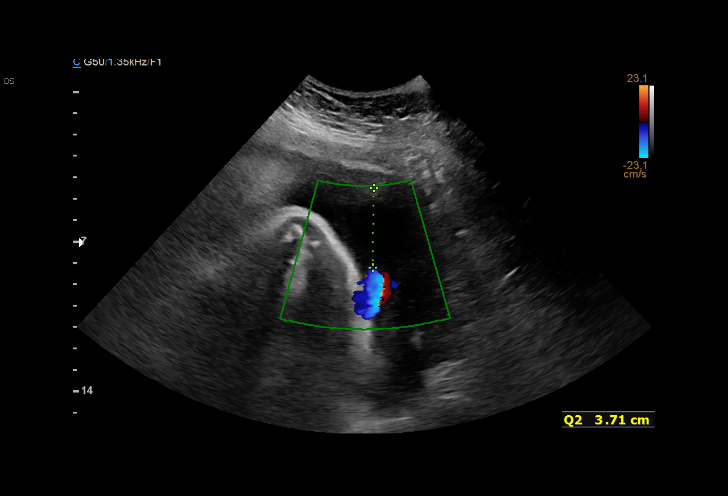
[im 5/26]
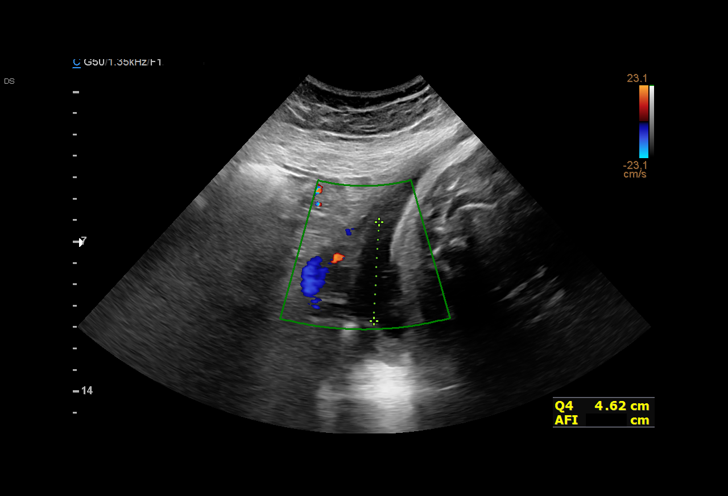
[im 7/26]
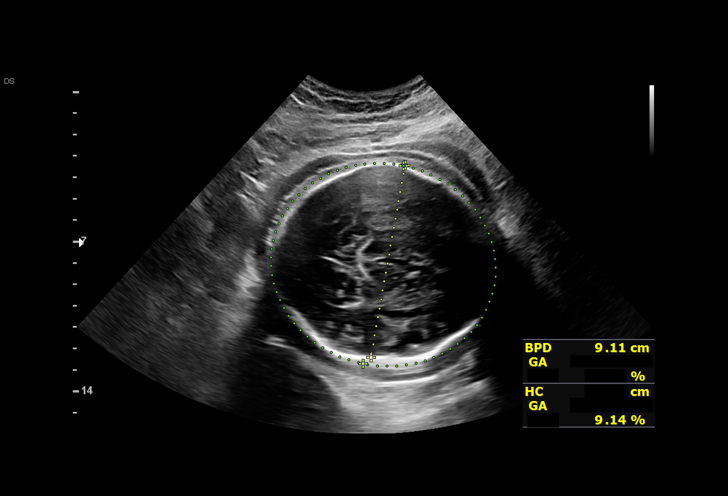
[im 9/26]
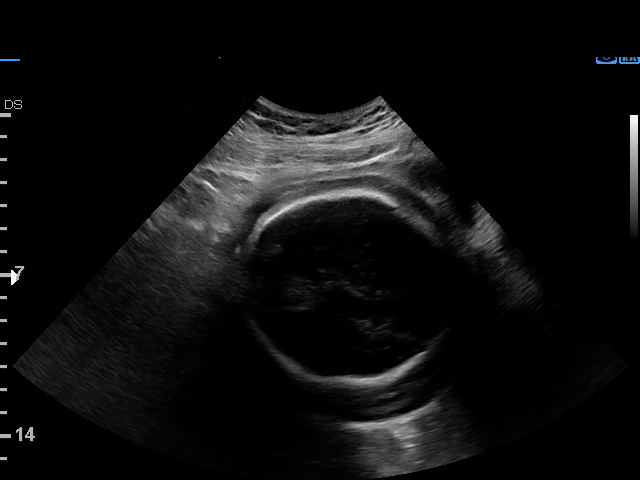
[im 11/26]
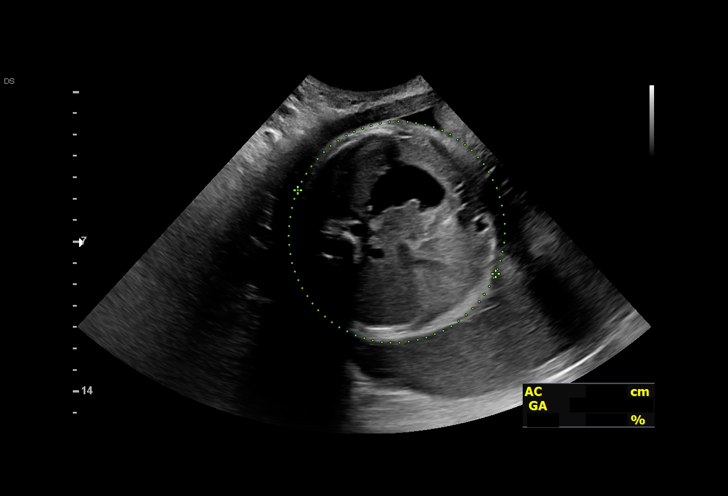
[im 13/26]
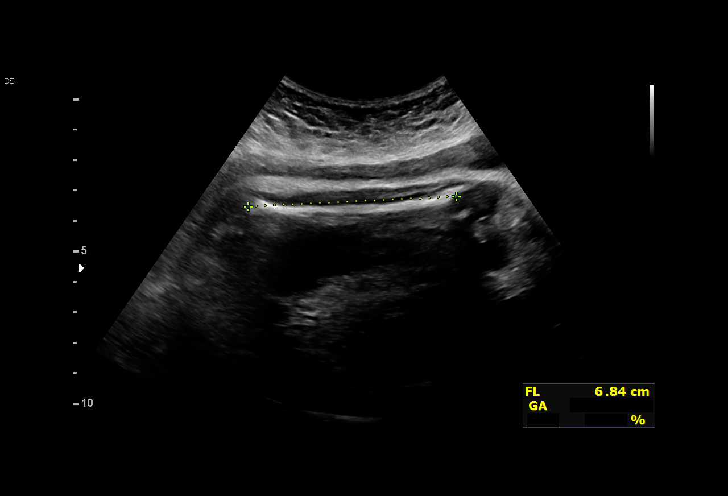
[im 14/26]
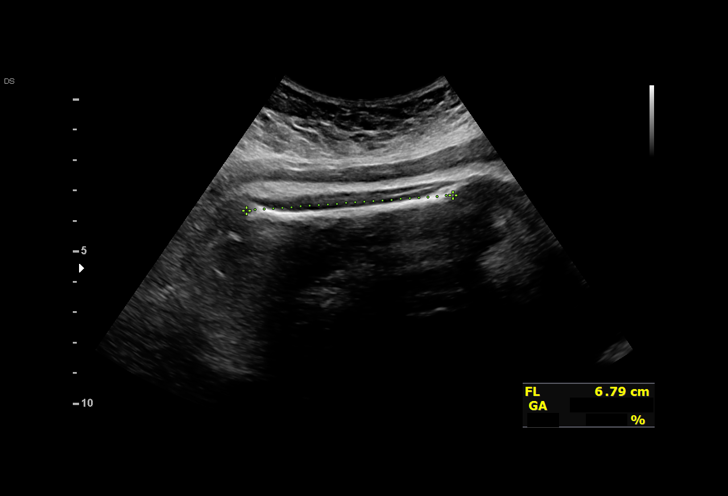
[im 16/26]
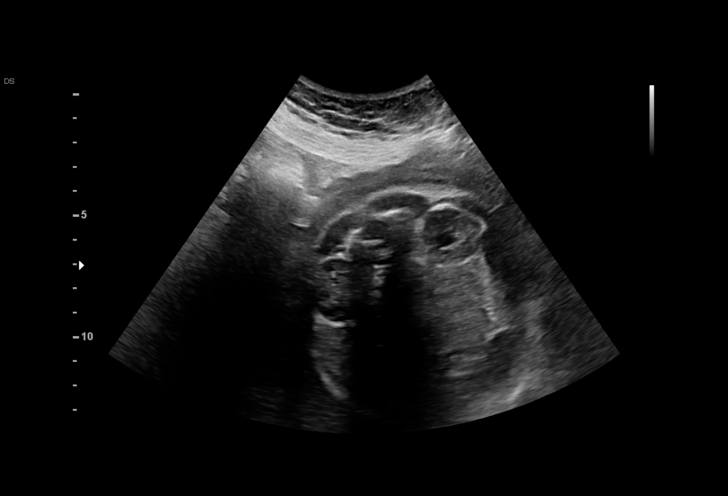
[im 18/26]
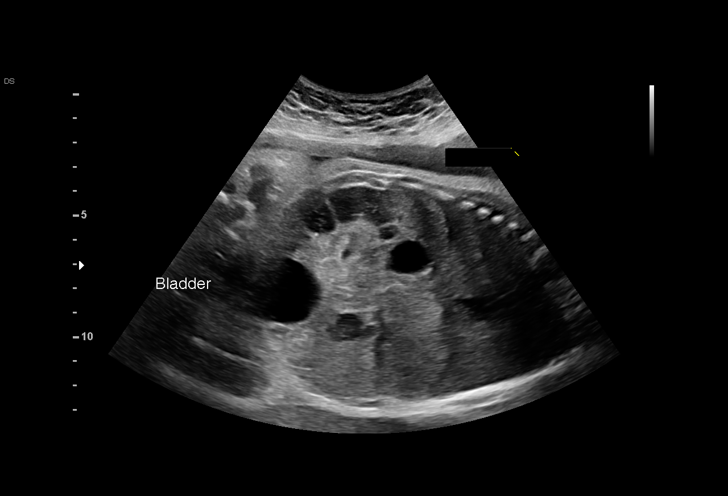
[im 20/26]
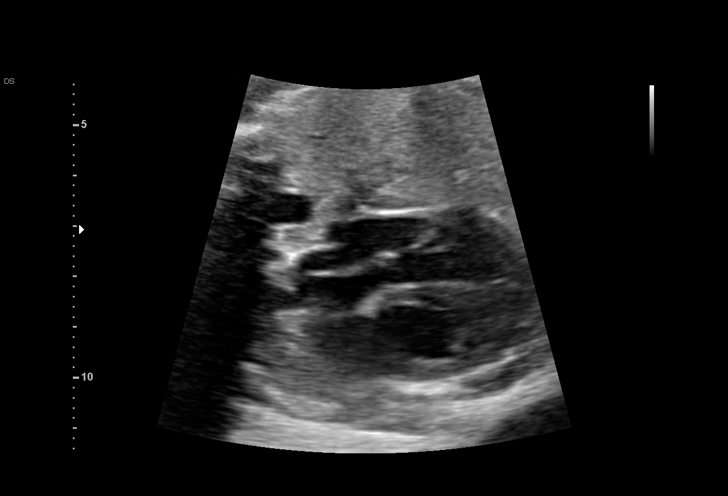
[im 22/26]
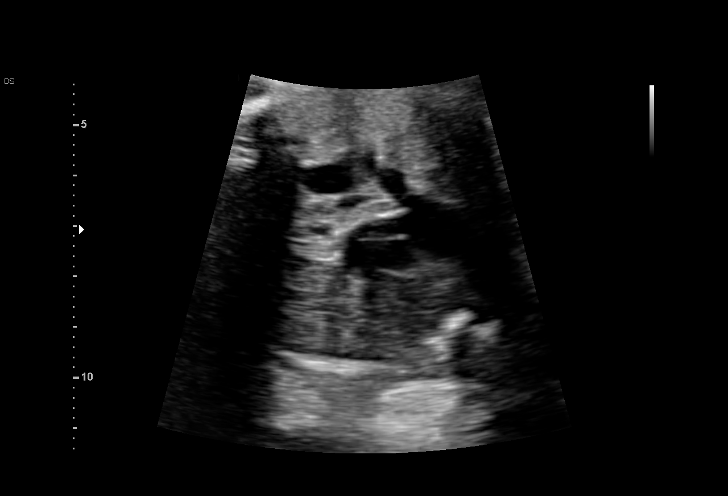
[im 24/26]
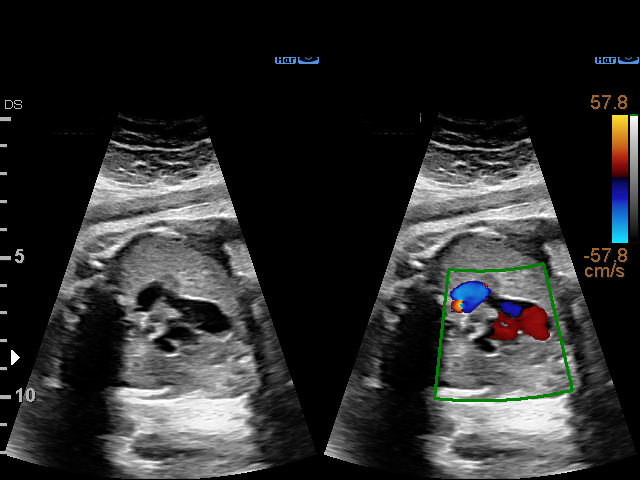
[im 26/26]
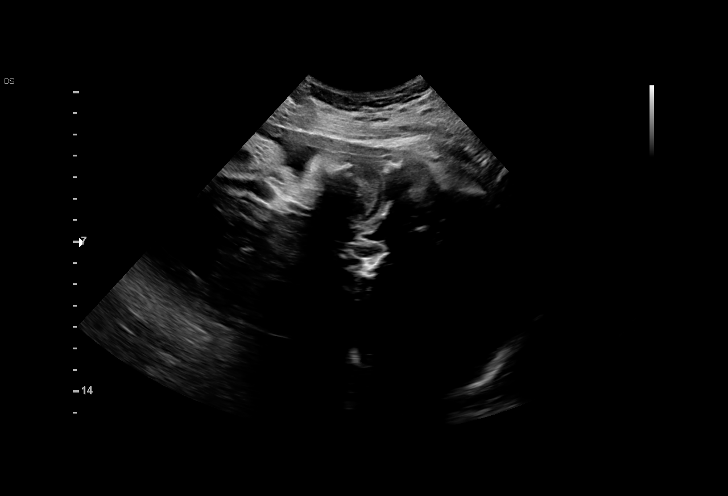

[14 of 26 positions shown; findings below may reference images not displayed]

CNM

Indications

 Obesity complicating pregnancy, second
 trimester (BMI 35)
 Medical complication of pregnancy
 (Endometriosis, Cervical Dysplasia)
 Thrombocytopenia affecting pregnancy,           O99.119,
 antepartum (Benign Gestational Platelets
 129 on 05-24-20)
 Genetic carrier (Isovaleric Anemia)
 Tobacco use complicating pregnancy
 Previous cesarean delivery, antepartum
 35 weeks gestation of pregnancy
Fetal Evaluation

 Num Of Fetuses:          1
 Cardiac Activity:        Observed
 Presentation:            Cephalic
 Placenta:                Posterior
 P. Cord Insertion:       Previously Visualized

 Amniotic Fluid
 AFI FV:      Within normal limits

 AFI Sum(cm)     %Tile       Largest Pocket(cm)
 15.1            55

 RUQ(cm)       RLQ(cm)       LUQ(cm)        LLQ(cm)

Biometry
 BPD:      90.4  mm     G. Age:  36w 4d         78  %    CI:          81.3  %    70 - 86
                                                         FL/HC:       21.2  %    20.1 -
 HC:      316.5  mm     G. Age:  35w 4d         14  %    HC/AC:       1.01       0.93 -
 AC:      314.9  mm     G. Age:  35w 3d         47  %    FL/BPD:      74.3  %    71 - 87
 FL:       67.2  mm     G. Age:  34w 4d         15  %    FL/AC:       21.3  %    20 - 24

 Est. FW:    3667   gm    5 lb 14 oz     35  %
OB History

 Gravidity:    3
 Living:       1
Gestational Age

 LMP:           35w 6d        Date:  10/23/19                 EDD:   07/29/20
 U/S Today:     35w 4d                                        EDD:   07/31/20
 Best:          35w 6d     Det. By:  LMP  (10/23/19)          EDD:   07/29/20
Anatomy

 Cranium:               Appears normal         RVOT:                   Appears normal
 Cavum:                 Appears normal         LVOT:                   Appears normal
 Ventricles:            Appears normal         Aortic Arch:            Appears normal
 Choroid Plexus:        Appears normal         Ductal Arch:            Appears normal
 Cerebellum:            Appears normal         Stomach:                Appears normal, left
                                                                       sided
 Posterior Fossa:       Appears normal         Kidneys:                Appear normal
 Lips:                  Appears normal         Bladder:                Appears normal
 Heart:                 Appears normal
                        (4CH, axis, and
                        situs)

 Other:  Other anatomy previously imaged and appeared normal. Prominent
         ductal bump
Impression

 Follow up growth due to elevated BMI and thrombocytopenia
 Normal interval growth with measurements consistent with
 dates
 Good fetal movement and amniotic fluid volume
Recommendations

 Follow up as clinically indicated.

## 2021-05-16 DIAGNOSIS — Z09 Encounter for follow-up examination after completed treatment for conditions other than malignant neoplasm: Secondary | ICD-10-CM | POA: Diagnosis not present

## 2021-05-20 DIAGNOSIS — M25561 Pain in right knee: Secondary | ICD-10-CM | POA: Diagnosis not present

## 2021-05-20 DIAGNOSIS — M6281 Muscle weakness (generalized): Secondary | ICD-10-CM | POA: Diagnosis not present

## 2021-05-23 DIAGNOSIS — M6281 Muscle weakness (generalized): Secondary | ICD-10-CM | POA: Diagnosis not present

## 2021-05-23 DIAGNOSIS — M25561 Pain in right knee: Secondary | ICD-10-CM | POA: Diagnosis not present

## 2021-05-25 DIAGNOSIS — M25561 Pain in right knee: Secondary | ICD-10-CM | POA: Diagnosis not present

## 2021-05-25 DIAGNOSIS — M6281 Muscle weakness (generalized): Secondary | ICD-10-CM | POA: Diagnosis not present

## 2021-05-30 DIAGNOSIS — M6281 Muscle weakness (generalized): Secondary | ICD-10-CM | POA: Diagnosis not present

## 2021-05-30 DIAGNOSIS — M25561 Pain in right knee: Secondary | ICD-10-CM | POA: Diagnosis not present

## 2021-06-06 DIAGNOSIS — M6281 Muscle weakness (generalized): Secondary | ICD-10-CM | POA: Diagnosis not present

## 2021-06-06 DIAGNOSIS — M25561 Pain in right knee: Secondary | ICD-10-CM | POA: Diagnosis not present

## 2021-07-06 DIAGNOSIS — M6281 Muscle weakness (generalized): Secondary | ICD-10-CM | POA: Diagnosis not present

## 2021-07-06 DIAGNOSIS — M25661 Stiffness of right knee, not elsewhere classified: Secondary | ICD-10-CM | POA: Diagnosis not present

## 2021-07-06 DIAGNOSIS — M25561 Pain in right knee: Secondary | ICD-10-CM | POA: Diagnosis not present

## 2021-11-03 ENCOUNTER — Emergency Department: Payer: Medicaid Other

## 2021-11-03 ENCOUNTER — Other Ambulatory Visit: Payer: Self-pay

## 2021-11-03 ENCOUNTER — Emergency Department
Admission: EM | Admit: 2021-11-03 | Discharge: 2021-11-03 | Disposition: A | Discharge status: Home / Self Care | Payer: Medicaid Other | Attending: Student in an Organized Health Care Education/Training Program | Admitting: Student in an Organized Health Care Education/Training Program

## 2021-11-03 DIAGNOSIS — S060XAA Concussion with loss of consciousness status unknown, initial encounter: Secondary | ICD-10-CM | POA: Diagnosis not present

## 2021-11-03 DIAGNOSIS — Z0471 Encounter for examination and observation following alleged adult physical abuse: Secondary | ICD-10-CM | POA: Diagnosis not present

## 2021-11-03 DIAGNOSIS — R0902 Hypoxemia: Secondary | ICD-10-CM | POA: Diagnosis not present

## 2021-11-03 DIAGNOSIS — S0083XA Contusion of other part of head, initial encounter: Secondary | ICD-10-CM | POA: Insufficient documentation

## 2021-11-03 DIAGNOSIS — R079 Chest pain, unspecified: Secondary | ICD-10-CM | POA: Diagnosis not present

## 2021-11-03 DIAGNOSIS — R58 Hemorrhage, not elsewhere classified: Secondary | ICD-10-CM | POA: Diagnosis not present

## 2021-11-03 DIAGNOSIS — S0990XA Unspecified injury of head, initial encounter: Secondary | ICD-10-CM | POA: Diagnosis present

## 2021-11-03 DIAGNOSIS — M542 Cervicalgia: Secondary | ICD-10-CM | POA: Diagnosis not present

## 2021-11-03 DIAGNOSIS — R609 Edema, unspecified: Secondary | ICD-10-CM | POA: Diagnosis not present

## 2021-11-03 DIAGNOSIS — S060X0A Concussion without loss of consciousness, initial encounter: Secondary | ICD-10-CM | POA: Diagnosis not present

## 2021-11-03 DIAGNOSIS — I959 Hypotension, unspecified: Secondary | ICD-10-CM | POA: Diagnosis not present

## 2021-11-03 DIAGNOSIS — S0003XA Contusion of scalp, initial encounter: Secondary | ICD-10-CM | POA: Diagnosis not present

## 2021-11-03 DIAGNOSIS — R0781 Pleurodynia: Secondary | ICD-10-CM | POA: Diagnosis not present

## 2021-11-03 LAB — CBC WITH DIFFERENTIAL/PLATELET
Abs Immature Granulocytes: 0.04 10*3/uL (ref 0.00–0.07)
Basophils Absolute: 0 10*3/uL (ref 0.0–0.1)
Basophils Relative: 0 %
Eosinophils Absolute: 0.1 10*3/uL (ref 0.0–0.5)
Eosinophils Relative: 0 %
HCT: 46.3 % — ABNORMAL HIGH (ref 36.0–46.0)
Hemoglobin: 15.2 g/dL — ABNORMAL HIGH (ref 12.0–15.0)
Immature Granulocytes: 0 %
Lymphocytes Relative: 9 %
Lymphs Abs: 1.2 10*3/uL (ref 0.7–4.0)
MCH: 28.5 pg (ref 26.0–34.0)
MCHC: 32.8 g/dL (ref 30.0–36.0)
MCV: 86.9 fL (ref 80.0–100.0)
Monocytes Absolute: 0.6 10*3/uL (ref 0.1–1.0)
Monocytes Relative: 5 %
Neutro Abs: 11.2 10*3/uL — ABNORMAL HIGH (ref 1.7–7.7)
Neutrophils Relative %: 86 %
Platelets: 199 10*3/uL (ref 150–400)
RBC: 5.33 MIL/uL — ABNORMAL HIGH (ref 3.87–5.11)
RDW: 13.5 % (ref 11.5–15.5)
WBC: 13.2 10*3/uL — ABNORMAL HIGH (ref 4.0–10.5)
nRBC: 0 % (ref 0.0–0.2)

## 2021-11-03 LAB — COMPREHENSIVE METABOLIC PANEL
ALT: 11 U/L (ref 0–44)
AST: 21 U/L (ref 15–41)
Albumin: 3.6 g/dL (ref 3.5–5.0)
Alkaline Phosphatase: 44 U/L (ref 38–126)
Anion gap: 5 (ref 5–15)
BUN: 6 mg/dL (ref 6–20)
CO2: 23 mmol/L (ref 22–32)
Calcium: 8.2 mg/dL — ABNORMAL LOW (ref 8.9–10.3)
Chloride: 111 mmol/L (ref 98–111)
Creatinine, Ser: 0.7 mg/dL (ref 0.44–1.00)
GFR, Estimated: >60 mL/min (ref >60)
Glucose, Bld: 103 mg/dL — ABNORMAL HIGH (ref 70–99)
Potassium: 3.3 mmol/L — ABNORMAL LOW (ref 3.5–5.1)
Sodium: 139 mmol/L (ref 135–145)
Total Bilirubin: 0.7 mg/dL (ref 0.3–1.2)
Total Protein: 6.4 g/dL — ABNORMAL LOW (ref 6.5–8.1)

## 2021-11-03 LAB — PROTIME-INR
INR: 1 (ref 0.8–1.2)
Prothrombin Time: 13.1 seconds (ref 11.4–15.2)

## 2021-11-03 LAB — APTT: aPTT: 24 seconds (ref 24–36)

## 2021-11-03 MED ORDER — OXYCODONE-ACETAMINOPHEN 5-325 MG PO TABS
1.0000 | ORAL_TABLET | Freq: Once | ORAL | Status: AC
  Administered 2021-11-03: 1 via ORAL
  Filled 2021-11-03: qty 1

## 2021-11-03 MED ORDER — ONDANSETRON 4 MG PO TBDP: 4.0000 mg | ORAL_TABLET | Freq: Three times a day (TID) | ORAL | 0 refills | Status: DC | PRN

## 2021-11-03 MED ORDER — OXYCODONE-ACETAMINOPHEN 5-325 MG PO TABS: 1.0000 | ORAL_TABLET | ORAL | 0 refills | Status: AC | PRN
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
## 2021-11-04 ENCOUNTER — Telehealth: Payer: Self-pay

## 2021-11-04 NOTE — Telephone Encounter (Signed)
Transition Care Management Unsuccessful Follow-up Telephone Call  Date of discharge and from where:  11/03/2021 from [facility]  Attempts:  1st Attempt  Reason for unsuccessful TCM follow-up call:  Left voice message

## 2021-11-08 NOTE — Telephone Encounter (Signed)
Transition Care Management Unsuccessful Follow-up Telephone Call  Date of discharge and from where:  11/03/2021 from [facility]  Attempts:  2nd Attempt  Reason for unsuccessful TCM follow-up call:  Left voice message

## 2021-11-10 NOTE — Telephone Encounter (Signed)
Transition Care Management Unsuccessful Follow-up Telephone Call  Date of discharge and from where:   11/03/2021 from [facility]  Attempts:  3rd Attempt  Reason for unsuccessful TCM follow-up call:  Left voice message

## 2022-01-10 ENCOUNTER — Encounter: Payer: Self-pay | Admitting: Family Medicine

## 2022-10-06 ENCOUNTER — Encounter: Payer: Self-pay | Admitting: Family Medicine

## 2023-06-26 ENCOUNTER — Emergency Department
Admission: EM | Admit: 2023-06-26 | Discharge: 2023-06-26 | Disposition: A | Discharge status: Home / Self Care | Attending: Emergency Medicine | Admitting: Emergency Medicine

## 2023-06-26 ENCOUNTER — Other Ambulatory Visit: Payer: Self-pay

## 2023-06-26 DIAGNOSIS — N9089 Other specified noninflammatory disorders of vulva and perineum: Secondary | ICD-10-CM | POA: Diagnosis not present

## 2023-06-26 DIAGNOSIS — J45909 Unspecified asthma, uncomplicated: Secondary | ICD-10-CM | POA: Diagnosis not present

## 2023-06-26 DIAGNOSIS — S3994XA Unspecified injury of external genitals, initial encounter: Secondary | ICD-10-CM | POA: Diagnosis present

## 2023-06-26 DIAGNOSIS — W19XXXA Unspecified fall, initial encounter: Secondary | ICD-10-CM | POA: Insufficient documentation

## 2023-06-26 DIAGNOSIS — N9489 Other specified conditions associated with female genital organs and menstrual cycle: Secondary | ICD-10-CM

## 2023-06-26 DIAGNOSIS — S3023XA Contusion of vagina and vulva, initial encounter: Secondary | ICD-10-CM | POA: Insufficient documentation

## 2023-06-26 MED ORDER — CEPHALEXIN 500 MG PO CAPS: 500.0000 mg | ORAL_CAPSULE | Freq: Four times a day (QID) | ORAL | 0 refills | Status: AC

## 2023-06-26 MED ORDER — OXYCODONE-ACETAMINOPHEN 7.5-325 MG PO TABS: 1.0000 | ORAL_TABLET | ORAL | 0 refills | Status: DC | PRN

## 2023-06-26 MED ORDER — MORPHINE SULFATE (PF) 4 MG/ML IV SOLN
4.0000 mg | Freq: Once | INTRAVENOUS | Status: AC
  Administered 2023-06-26: 4 mg via INTRAMUSCULAR
  Filled 2023-06-26: qty 1

## 2023-06-26 MED ORDER — LIDOCAINE HCL (PF) 1 % IJ SOLN
5.0000 mL | Freq: Once | INTRAMUSCULAR | Status: AC
  Administered 2023-06-26: 5 mL
  Filled 2023-06-26: qty 5

## 2023-06-26 MED ORDER — OXYCODONE-ACETAMINOPHEN 5-325 MG PO TABS
1.0000 | ORAL_TABLET | Freq: Once | ORAL | Status: AC
  Administered 2023-06-26: 1 via ORAL
  Filled 2023-06-26: qty 1

## 2023-06-26 NOTE — ED Triage Notes (Signed)
 Pt to ED via POV from home. Pt reports has a bartholin cyst and fell backwards onto a tree stump and feels like the cyst moved. Pt reports 10/10. PT very tearful in triage.

## 2023-06-26 NOTE — ED Notes (Signed)
 Pt has been medicated for pain, but does not feel ready for I&D. Given call bell to let staff know when she feels ready for procedure. Pt also states she wants her SO present, and he is on the way currently

## 2023-06-26 NOTE — Discharge Instructions (Addendum)
 Your evaluated in the ED for a Bartholin cyst and trauma to the same area.  We performed an incision and drainage which was unsuccessful due to pain tolerance.  We do suspect this to be 2 problems in the same area including a infected Bartholin cyst plus trauma due to the injury stated.  Antibiotics have been sent.  Please take as instructed.  Apply warm compresses and take sitz bath's to help alleviate pain.  Do not take Tylenol with pain medication prescribed.  Please follow-up with OB/GYN for further management.  Dr. Annie Barton offices that [facility] [facility] and Dr. Kathlen Para stat in which you have seen prior is in [place].

## 2023-06-26 NOTE — ED Provider Notes (Signed)
 [facility] Emergency Department Provider Note     Event Date/Time   First MD Initiated Contact with Patient 06/26/23 1312     (approximate)   History   Abscess   HPI  Rachel Gregory is a 33 y.o. female with a history of endometriosis and asthma presents to the ED for evaluation of her left labia following a fall onto a tree stump while playing with her children earlier today.  Patient reports she had a bartholin cyst in this area for sometime that she has been treating with warm sitz bath's.  She reports it started off as a small ingrown hair that she popped then grew to the size of a nickel. After the incident today her entire left labia quickly increased to the size of a golf ball. No drainage. Severe pain.     Physical Exam   Triage Vital Signs: ED Triage Vitals  Encounter Vitals Group     BP 06/26/23 1248 (!) 154/103     Systolic BP Percentile --      Diastolic BP Percentile --      Pulse Rate 06/26/23 1248 (!) 111     Resp 06/26/23 1248 20     Temp 06/26/23 1247 98 F (36.7 C)     Temp Source 06/26/23 1247 Oral     SpO2 06/26/23 1248 98 %     Weight --      Height --      Head Circumference --      Peak Flow --      Pain Score 06/26/23 1247 10     Pain Loc --      Pain Education --      Exclude from Growth Chart --     Most recent vital signs: Vitals:   06/26/23 1357 06/26/23 1604  BP: (!) 148/110 130/82  Pulse: 95 63  Resp: (!) 22 18  Temp: 98.4 F (36.9 C)   SpO2: 100% 99%    General Awake, no distress.  HEENT NCAT. PERRL. EOMI.  CV:  Good peripheral perfusion.  RESP:  Normal effort.  ABD:  No distention.  Other:  Please see media. Pain out of proportion to left side labia majora. Labia minora reveals mild swelling.      ED Results / Procedures / Treatments   Labs (all labs ordered are listed, but only abnormal results are displayed) Labs Reviewed - No data to display  No results  found.  PROCEDURES:  Critical Care performed: No  .Incision and Drainage  Date/Time: 06/26/2023 4:55 PM  Performed by: Kern Reap A, PA-C Authorized by: Kern Reap A, PA-C   Consent:    Consent obtained:  Verbal   Consent given by:  Patient   Risks discussed:  Bleeding, incomplete drainage, pain and infection Procedure details:    Incision types:  Stab incision   Drainage:  Bloody   Drainage amount:  Scant   Wound treatment:  Wound left open Post-procedure details:    Procedure completion:  Procedure terminated at patient's request Comments:     Patient could not tolerate due to pain    MEDICATIONS ORDERED IN ED: Medications  morphine (PF) 4 MG/ML injection 4 mg (4 mg Intramuscular Given 06/26/23 1351)  lidocaine (PF) (XYLOCAINE) 1 % injection 5 mL (5 mLs Infiltration Given by Other 06/26/23 1450)  oxyCODONE-acetaminophen (PERCOCET/ROXICET) 5-325 MG per tablet 1 tablet (1 tablet Oral Given 06/26/23 1559)     IMPRESSION / MDM / ASSESSMENT  AND PLAN / ED COURSE  I reviewed the triage vital signs and the nursing notes.                               33 y.o. female presents to the emergency department for evaluation and treatment of labia trauma. See HPI for further details.   Differential diagnosis includes, but is not limited to labia hematoma, bartholin cyst, infected bartholin cyst   Patient's presentation is most consistent with acute complicated illness / injury requiring diagnostic workup.  Patient is alert and oriented.  She is initially tachycardic at 111 with an elevated BP of 154/103 which I suspect is due to the injury occurred to her left labia.  Throughout the ED duration vital signs improved to within normal limits.  IM morphine provided for pain prior to I&D procedure which was unsuccessful given absence of purulent drainage.  Please see procedure note.  High suspicions for a labial hematoma due to trauma versus infected Bartholin cyst, however I will send  antibiotics to cover for possible infection until patient can follow-up with OB/GYN.  ED return precautions discussed thoroughly.  Patient verbalized understanding.  Patient is in stable condition for discharge home.  Encouraged warm sitz bath's.   FINAL CLINICAL IMPRESSION(S) / ED DIAGNOSES   Final diagnoses:  Labial swelling  Hematoma of labia majora   Rx / DC Orders   ED Discharge Orders          Ordered    oxyCODONE-acetaminophen (PERCOCET) 7.5-325 MG tablet  Every 4 hours PRN        06/26/23 1554    cephALEXin (KEFLEX) 500 MG capsule  4 times daily        06/26/23 1555           Note:  This document was prepared using Dragon voice recognition software and may include unintentional dictation errors.    Phyllis Breeze, Jaydalee Bardwell A, PA-C 06/26/23 1657    Shane Darling, MD 06/26/23 [phone]

## 2023-06-26 NOTE — ED Notes (Signed)
 Pt reports her aunt is driving her home.

## 2023-06-26 NOTE — ED Notes (Signed)
 Pt is calling for ride home at this time.

## 2023-06-26 NOTE — ED Notes (Signed)
..  The patient is A&OX4,wheeled out of ED via wheelchair.  Pt verbalized understanding of d/c instructions, prescriptions and follow up care.

## 2024-02-06 ENCOUNTER — Ambulatory Visit: Payer: Self-pay

## 2024-02-06 VITALS — BP 108/74 | HR 102 | Ht 66.0 in | Wt 200.6 lb

## 2024-02-06 DIAGNOSIS — Z30432 Encounter for removal of intrauterine contraceptive device: Secondary | ICD-10-CM

## 2024-02-06 DIAGNOSIS — Z3009 Encounter for other general counseling and advice on contraception: Secondary | ICD-10-CM

## 2024-02-06 NOTE — Addendum Note (Signed)
 Addended by: ROSABEL DAMIEN BRAVO on: 02/06/2024 02:36 PM   Modules accepted: Level of Service

## 2024-02-06 NOTE — Progress Notes (Signed)
 Pt is here IUD removal. IUD removed successfully by Damien Satchel, [facility] and pt tolerated well to the process with no complications. Opportunity given to pt to ask questions for any clarifications. Questions answered. Wilkie Drought, RN.

## 2024-02-06 NOTE — Progress Notes (Addendum)
 error

## 2024-02-06 NOTE — Progress Notes (Signed)
  [facility] [address], [address] [place] [address] Main phone: [phone]  Women's Health Problem Visit   Subjective:  Rachel Gregory is a 33 y.o. being seen today for IUD removal.   Chief Complaint  Patient presents with   Contraception   HPI: Patient desires IUD removal today. She feels it causes her discomfort and does not want it anymore. Does not want anything else for contraception. Declines physical exam, STI testing, pap today. States she only wants the IUD removed. Declines breast exam and denies breast concerns.  Current smoker but she does not want to quit.   Health Maintenance Due  Topic Date Due   Pneumococcal Vaccine (1 of 2 - PCV) Never done   HPV VACCINES (1 - 3-dose SCDM series) Never done   Cervical Cancer Screening (HPV/Pap Cotest)  01/12/2023   Influenza Vaccine  10/12/2023   COVID-19 Vaccine (1 - 2025-26 season) Never done   The following portions of the patient's history were reviewed and updated as appropriate: allergies, current medications, past family history, past medical history, past social history, past surgical history and problem list. Problem list updated.  See flowsheet for other program required questions.  Objective:   Vitals:   02/06/24 1351  BP: 108/74  Pulse: (!) 102  Weight: 200 lb 9.6 oz (91 kg)  Height: 5' 6 (1.676 m)   Physical Exam Exam conducted with a chaperone present Brett Orange).  Constitutional:      Appearance: Normal appearance.  HENT:     Head: Normocephalic.     Mouth/Throat:     Mouth: Mucous membranes are moist.  Eyes:     General: No scleral icterus.       Right eye: No discharge.        Left eye: No discharge.  Pulmonary:     Effort: Pulmonary effort is normal.  Genitourinary:    Pubic Area: No rash.      Labia:        Right: No rash, tenderness, lesion or injury.        Left: No rash, tenderness, lesion or injury.      Vagina:  Normal.     Cervix: Normal.     Comments: IUD strings visualized and IUD removed Skin:    General: Skin is warm and dry.  Neurological:     General: No focal deficit present.     Mental Status: She is alert.  Psychiatric:        Mood and Affect: Mood normal.        Behavior: Behavior normal.    Assessment and Plan:  Rachel Gregory is a 33 y.o. female presenting to the [facility] for a Women's Health problem visit  1. Encounter for IUD removal (Primary)  Procedure:  IUD Removal  Patient identified, informed consent performed, consent signed.  Patient was in the dorsal lithotomy position, normal external genitalia was noted.  A speculum was placed in the patient's vagina, normal discharge was noted, no lesions. The cervix was visualized, no lesions, no abnormal discharge.  The strings of the IUD were grasped and pulled using ring forceps. The IUD was removed in its entirety.  Patient tolerated the procedure well.    Patient declines contraception. Routine preventative health maintenance measures emphasized.   Return for well woman care when able.  No future appointments.  Damien FORBES Satchel, NP
